# Patient Record
Sex: Male | Born: 1965 | Race: White | Hispanic: No | Marital: Married | Smoking: Never smoker
Health system: Southern US, Community
[De-identification: ages and names within clinical notes are randomized; demographics above are authoritative.]

## PROBLEM LIST (undated history)

## (undated) DIAGNOSIS — D126 Benign neoplasm of colon, unspecified: Secondary | ICD-10-CM

## (undated) DIAGNOSIS — M109 Gout, unspecified: Secondary | ICD-10-CM

## (undated) DIAGNOSIS — I10 Essential (primary) hypertension: Secondary | ICD-10-CM

## (undated) DIAGNOSIS — R011 Cardiac murmur, unspecified: Secondary | ICD-10-CM

## (undated) DIAGNOSIS — N528 Other male erectile dysfunction: Secondary | ICD-10-CM

## (undated) DIAGNOSIS — E785 Hyperlipidemia, unspecified: Secondary | ICD-10-CM

## (undated) DIAGNOSIS — I7300 Raynaud's syndrome without gangrene: Secondary | ICD-10-CM

## (undated) DIAGNOSIS — R001 Bradycardia, unspecified: Secondary | ICD-10-CM

## (undated) DIAGNOSIS — T7840XA Allergy, unspecified, initial encounter: Secondary | ICD-10-CM

## (undated) HISTORY — DX: Cardiac murmur, unspecified: R01.1

## (undated) HISTORY — DX: Essential (primary) hypertension: I10

## (undated) HISTORY — DX: Bradycardia, unspecified: R00.1

## (undated) HISTORY — DX: Benign neoplasm of colon, unspecified: D12.6

## (undated) HISTORY — DX: Hyperlipidemia, unspecified: E78.5

## (undated) HISTORY — DX: Other male erectile dysfunction: N52.8

## (undated) HISTORY — DX: Allergy, unspecified, initial encounter: T78.40XA

## (undated) HISTORY — PX: COARCTATION OF AORTA REPAIR: SHX261

## (undated) HISTORY — PX: HERNIA REPAIR: SHX51

## (undated) HISTORY — DX: Gout, unspecified: M10.9

## (undated) HISTORY — DX: Raynaud's syndrome without gangrene: I73.00

---

## 2002-04-28 ENCOUNTER — Encounter: Admission: RE | Admit: 2002-04-28 | Discharge: 2002-04-28 | Payer: Self-pay | Admitting: Sports Medicine

## 2006-02-01 ENCOUNTER — Encounter: Admission: RE | Admit: 2006-02-01 | Discharge: 2006-02-01 | Payer: Self-pay | Admitting: Neurology

## 2011-03-06 DIAGNOSIS — R9431 Abnormal electrocardiogram [ECG] [EKG]: Secondary | ICD-10-CM | POA: Insufficient documentation

## 2011-03-06 DIAGNOSIS — I10 Essential (primary) hypertension: Secondary | ICD-10-CM | POA: Insufficient documentation

## 2011-03-06 DIAGNOSIS — Q251 Coarctation of aorta: Secondary | ICD-10-CM | POA: Insufficient documentation

## 2016-03-25 DIAGNOSIS — Z0001 Encounter for general adult medical examination with abnormal findings: Secondary | ICD-10-CM | POA: Diagnosis not present

## 2016-03-25 DIAGNOSIS — Z23 Encounter for immunization: Secondary | ICD-10-CM | POA: Diagnosis not present

## 2016-03-25 DIAGNOSIS — Z0000 Encounter for general adult medical examination without abnormal findings: Secondary | ICD-10-CM | POA: Diagnosis not present

## 2016-03-25 DIAGNOSIS — Z6825 Body mass index (BMI) 25.0-25.9, adult: Secondary | ICD-10-CM | POA: Diagnosis not present

## 2016-06-26 DIAGNOSIS — E782 Mixed hyperlipidemia: Secondary | ICD-10-CM | POA: Diagnosis not present

## 2016-06-26 DIAGNOSIS — R7301 Impaired fasting glucose: Secondary | ICD-10-CM | POA: Diagnosis not present

## 2016-06-26 DIAGNOSIS — I10 Essential (primary) hypertension: Secondary | ICD-10-CM | POA: Diagnosis not present

## 2016-06-26 DIAGNOSIS — I7300 Raynaud's syndrome without gangrene: Secondary | ICD-10-CM | POA: Diagnosis not present

## 2016-08-09 DIAGNOSIS — H00019 Hordeolum externum unspecified eye, unspecified eyelid: Secondary | ICD-10-CM | POA: Diagnosis not present

## 2016-08-12 DIAGNOSIS — H0014 Chalazion left upper eyelid: Secondary | ICD-10-CM | POA: Diagnosis not present

## 2016-11-27 DIAGNOSIS — E782 Mixed hyperlipidemia: Secondary | ICD-10-CM | POA: Diagnosis not present

## 2016-11-27 DIAGNOSIS — I10 Essential (primary) hypertension: Secondary | ICD-10-CM | POA: Diagnosis not present

## 2016-11-27 DIAGNOSIS — R7301 Impaired fasting glucose: Secondary | ICD-10-CM | POA: Diagnosis not present

## 2017-06-16 DIAGNOSIS — R7301 Impaired fasting glucose: Secondary | ICD-10-CM | POA: Diagnosis not present

## 2017-06-16 DIAGNOSIS — M545 Low back pain: Secondary | ICD-10-CM | POA: Diagnosis not present

## 2017-06-16 DIAGNOSIS — Z125 Encounter for screening for malignant neoplasm of prostate: Secondary | ICD-10-CM | POA: Diagnosis not present

## 2017-06-16 DIAGNOSIS — E782 Mixed hyperlipidemia: Secondary | ICD-10-CM | POA: Diagnosis not present

## 2017-06-16 DIAGNOSIS — Z0001 Encounter for general adult medical examination with abnormal findings: Secondary | ICD-10-CM | POA: Diagnosis not present

## 2017-06-16 DIAGNOSIS — N528 Other male erectile dysfunction: Secondary | ICD-10-CM | POA: Diagnosis not present

## 2017-07-01 DIAGNOSIS — Q251 Coarctation of aorta: Secondary | ICD-10-CM | POA: Diagnosis not present

## 2017-07-01 DIAGNOSIS — I158 Other secondary hypertension: Secondary | ICD-10-CM | POA: Diagnosis not present

## 2017-07-08 DIAGNOSIS — I371 Nonrheumatic pulmonary valve insufficiency: Secondary | ICD-10-CM | POA: Diagnosis not present

## 2017-07-08 DIAGNOSIS — I517 Cardiomegaly: Secondary | ICD-10-CM | POA: Diagnosis not present

## 2017-07-08 DIAGNOSIS — R001 Bradycardia, unspecified: Secondary | ICD-10-CM | POA: Diagnosis not present

## 2017-07-08 DIAGNOSIS — I351 Nonrheumatic aortic (valve) insufficiency: Secondary | ICD-10-CM | POA: Diagnosis not present

## 2017-07-08 DIAGNOSIS — Q251 Coarctation of aorta: Secondary | ICD-10-CM | POA: Diagnosis not present

## 2017-08-04 ENCOUNTER — Encounter: Payer: Self-pay | Admitting: Family Medicine

## 2017-08-17 ENCOUNTER — Encounter: Payer: Self-pay | Admitting: Gastroenterology

## 2017-08-23 ENCOUNTER — Other Ambulatory Visit: Payer: Self-pay

## 2017-08-23 ENCOUNTER — Ambulatory Visit (AMBULATORY_SURGERY_CENTER): Payer: Self-pay

## 2017-08-23 VITALS — Ht 71.0 in | Wt 180.6 lb

## 2017-08-23 DIAGNOSIS — J208 Acute bronchitis due to other specified organisms: Secondary | ICD-10-CM | POA: Diagnosis not present

## 2017-08-23 DIAGNOSIS — Z1211 Encounter for screening for malignant neoplasm of colon: Secondary | ICD-10-CM

## 2017-08-23 MED ORDER — PEG-KCL-NACL-NASULF-NA ASC-C 140 G PO SOLR: 1.0000 | Freq: Once | ORAL | 0 refills | Status: AC

## 2017-08-23 NOTE — Progress Notes (Signed)
Denies allergies to eggs or soy products. Denies complication of anesthesia or sedation. Denies use of weight loss medication. Denies use of O2.   Emmi instructions declined.  

## 2017-08-25 ENCOUNTER — Encounter: Payer: Self-pay | Admitting: Gastroenterology

## 2017-09-08 ENCOUNTER — Encounter: Payer: Self-pay | Admitting: Gastroenterology

## 2017-09-08 ENCOUNTER — Ambulatory Visit (AMBULATORY_SURGERY_CENTER): Payer: BLUE CROSS/BLUE SHIELD | Admitting: Gastroenterology

## 2017-09-08 ENCOUNTER — Other Ambulatory Visit: Payer: Self-pay

## 2017-09-08 VITALS — BP 129/85 | HR 46 | Temp 97.5°F | Resp 10 | Ht 71.0 in | Wt 180.0 lb

## 2017-09-08 DIAGNOSIS — D123 Benign neoplasm of transverse colon: Secondary | ICD-10-CM | POA: Diagnosis not present

## 2017-09-08 DIAGNOSIS — D121 Benign neoplasm of appendix: Secondary | ICD-10-CM | POA: Diagnosis not present

## 2017-09-08 DIAGNOSIS — K635 Polyp of colon: Secondary | ICD-10-CM

## 2017-09-08 DIAGNOSIS — D120 Benign neoplasm of cecum: Secondary | ICD-10-CM | POA: Diagnosis not present

## 2017-09-08 DIAGNOSIS — Z1211 Encounter for screening for malignant neoplasm of colon: Secondary | ICD-10-CM

## 2017-09-08 MED ORDER — SODIUM CHLORIDE 0.9 % IV SOLN: 500.0000 mL | Freq: Once | INTRAVENOUS | Status: DC

## 2017-09-08 NOTE — Progress Notes (Signed)
Pt's states no medical or surgical changes since previsit or office visit. 

## 2017-09-08 NOTE — Op Note (Signed)
[place] Patient Name: Dillon Lee Procedure Date: 09/08/2017 2:13 PM MRN: [id number] Endoscopist: Mallie Mussel L. Loletha Carrow , MD Age: 52 Referring MD:  Date of Birth: 1965-12-14 Gender: Male Account #: [id number] Procedure:                Colonoscopy Indications:              Screening for colorectal malignant neoplasm, This                            is the patient's first colonoscopy Medicines:                Monitored Anesthesia Care Procedure:                Pre-Anesthesia Assessment:                           - Prior to the procedure, a History and Physical                            was performed, and patient medications and                            allergies were reviewed. The patient's tolerance of                            previous anesthesia was also reviewed. The risks                            and benefits of the procedure and the sedation                            options and risks were discussed with the patient.                            All questions were answered, and informed consent                            was obtained. Prior Anticoagulants: The patient has                            taken no previous anticoagulant or antiplatelet                            agents. ASA Grade Assessment: II - A patient with                            mild systemic disease. After reviewing the risks                            and benefits, the patient was deemed in                            satisfactory condition to undergo the procedure.  After obtaining informed consent, the colonoscope                            was passed under direct vision. Throughout the                            procedure, the patient's blood pressure, pulse, and                            oxygen saturations were monitored continuously. The                            Colonoscope was introduced through the anus and                            advanced to the the cecum,  identified by                            appendiceal orifice and ileocecal valve. The                            colonoscopy was performed without difficulty. The                            patient tolerated the procedure well. The quality                            of the bowel preparation was good. The ileocecal                            valve, appendiceal orifice, and rectum were                            photographed. The quality of the bowel preparation                            was evaluated using the BBPS [facility] Bowel                            Preparation Scale) with scores of: Right Colon = 2,                            Transverse Colon = 2 and Left Colon = 2. The total                            BBPS score equals 6. Scope In: 2:18:55 PM Scope Out: 2:34:28 PM Scope Withdrawal Time: 0 hours 11 minutes 36 seconds  Total Procedure Duration: 0 hours 15 minutes 33 seconds  Findings:                 The perianal and digital rectal examinations were                            normal.  Two sessile polyps were found in the hepatic                            flexure and appendiceal orifice. The polyps were 3                            (AO) to 6(TC) mm in size. These polyps were removed                            with a cold snare. Resection and retrieval were                            complete.                           The exam was otherwise without abnormality on                            direct and retroflexion views. Complications:            No immediate complications. Estimated Blood Loss:     Estimated blood loss was minimal. Impression:               - Two 3 to 6 mm polyps at the hepatic flexure and                            at the appendiceal orifice, removed with a cold                            snare. Resected and retrieved.                           - The examination was otherwise normal on direct                            and retroflexion  views. Recommendation:           - Patient has a contact number available for                            emergencies. The signs and symptoms of potential                            delayed complications were discussed with the                            patient. Return to normal activities tomorrow.                            Written discharge instructions were provided to the                            patient.                           -  Resume previous diet.                           - Continue present medications.                           - Await pathology results.                           - Repeat colonoscopy is recommended for                            surveillance. The colonoscopy date will be                            determined after pathology results from today's                            exam become available for review. Ozetta Flatley L. Loletha Carrow, MD 09/08/2017 2:40:01 PM This report has been signed electronically.

## 2017-09-08 NOTE — Progress Notes (Signed)
Called to room to assist during endoscopic procedure.  Patient ID and intended procedure confirmed with present staff. Received instructions for my participation in the procedure from the performing physician.  

## 2017-09-08 NOTE — Patient Instructions (Signed)
YOU HAD AN ENDOSCOPIC PROCEDURE TODAY AT [facility]:   Refer to the procedure report that was given to you for any specific questions about what was found during the examination.  If the procedure report does not answer your questions, please call your gastroenterologist to clarify.  If you requested that your care partner not be given the details of your procedure findings, then the procedure report has been included in a sealed envelope for you to review at your convenience later.  YOU SHOULD EXPECT: Some feelings of bloating in the abdomen. Passage of more gas than usual.  Walking can help get rid of the air that was put into your GI tract during the procedure and reduce the bloating. If you had a lower endoscopy (such as a colonoscopy or flexible sigmoidoscopy) you may notice spotting of blood in your stool or on the toilet paper. If you underwent a bowel prep for your procedure, you may not have a normal bowel movement for a few days.  Please Note:  You might notice some irritation and congestion in your nose or some drainage.  This is from the oxygen used during your procedure.  There is no need for concern and it should clear up in a day or so.  SYMPTOMS TO REPORT IMMEDIATELY:   Following lower endoscopy (colonoscopy or flexible sigmoidoscopy):  Excessive amounts of blood in the stool  Significant tenderness or worsening of abdominal pains  Swelling of the abdomen that is new, acute  Fever of 100F or higher   Following upper endoscopy (EGD)  Vomiting of blood or coffee ground material  New chest pain or pain under the shoulder blades  Painful or persistently difficult swallowing  New shortness of breath  Fever of 100F or higher  Black, tarry-looking stools  For urgent or emergent issues, a gastroenterologist can be reached at any hour by calling [phone].   DIET:  We do recommend a small meal at first, but then you may proceed to your regular diet.  Drink  plenty of fluids but you should avoid alcoholic beverages for 24 hours.  ACTIVITY:  You should plan to take it easy for the rest of today and you should NOT DRIVE or use heavy machinery until tomorrow (because of the sedation medicines used during the test).    FOLLOW UP: Our staff will call the number listed on your records the next business day following your procedure to check on you and address any questions or concerns that you may have regarding the information given to you following your procedure. If we do not reach you, we will leave a message.  However, if you are feeling well and you are not experiencing any problems, there is no need to return our call.  We will assume that you have returned to your regular daily activities without incident.  If any biopsies were taken you will be contacted by phone or by letter within the next 1-3 weeks.  Please call us at [phone] if you have not heard about the biopsies in 3 weeks.    SIGNATURES/CONFIDENTIALITY: You and/or your care partner have signed paperwork which will be entered into your electronic medical record.  These signatures attest to the fact that that the information above on your After Visit Summary has been reviewed and is understood.  Full responsibility of the confidentiality of this discharge information lies with you and/or your care-partner.YOU HAD AN ENDOSCOPIC PROCEDURE TODAY AT [facility]:  Refer to the procedure report that was given to you for any specific questions about what was found during the examination.  If the procedure report does not answer your questions, please call your gastroenterologist to clarify.  If you requested that your care partner not be given the details of your procedure findings, then the procedure report has been included in a sealed envelope for you to review at your convenience later.  YOU SHOULD EXPECT: Some feelings of bloating in the abdomen. Passage of more gas than  usual.  Walking can help get rid of the air that was put into your GI tract during the procedure and reduce the bloating. If you had a lower endoscopy (such as a colonoscopy or flexible sigmoidoscopy) you may notice spotting of blood in your stool or on the toilet paper. If you underwent a bowel prep for your procedure, you may not have a normal bowel movement for a few days.  Please Note:  You might notice some irritation and congestion in your nose or some drainage.  This is from the oxygen used during your procedure.  There is no need for concern and it should clear up in a day or so.  SYMPTOMS TO REPORT IMMEDIATELY:   Following lower endoscopy (colonoscopy or flexible sigmoidoscopy):  Excessive amounts of blood in the stool  Significant tenderness or worsening of abdominal pains  Swelling of the abdomen that is new, acute  Fever of 100F or higher   Following upper endoscopy (EGD)  Vomiting of blood or coffee ground material  New chest pain or pain under the shoulder blades  Painful or persistently difficult swallowing  New shortness of breath  Fever of 100F or higher  Black, tarry-looking stools  For urgent or emergent issues, a gastroenterologist can be reached at any hour by calling [phone].   DIET:  We do recommend a small meal at first, but then you may proceed to your regular diet.  Drink plenty of fluids but you should avoid alcoholic beverages for 24 hours.  ACTIVITY:  You should plan to take it easy for the rest of today and you should NOT DRIVE or use heavy machinery until tomorrow (because of the sedation medicines used during the test).    FOLLOW UP: Our staff will call the number listed on your records the next business day following your procedure to check on you and address any questions or concerns that you may have regarding the information given to you following your procedure. If we do not reach you, we will leave a message.  However, if you are feeling  well and you are not experiencing any problems, there is no need to return our call.  We will assume that you have returned to your regular daily activities without incident.  If any biopsies were taken you will be contacted by phone or by letter within the next 1-3 weeks.  Please call us at [phone] if you have not heard about the biopsies in 3 weeks.   SIGNATURES/CONFIDENTIALITY: You and/or your care partner have signed paperwork which will be entered into your electronic medical record.  These signatures attest to the fact that that the information above on your After Visit Summary has been reviewed and is understood.  Full responsibility of the confidentiality of this discharge information lies with you and/or your care-partner.  Await pathology  Please read over handout about polyps  Continue your normal medications

## 2017-09-08 NOTE — Progress Notes (Signed)
A and O x3. Report to RN. Tolerated MAC anesthesia well.

## 2017-09-09 ENCOUNTER — Telehealth: Payer: Self-pay

## 2017-09-09 NOTE — Telephone Encounter (Signed)
  Follow up Call-  Call back number 09/08/2017  Post procedure Call Back phone  # [phone] cell  Permission to leave phone message Yes  Some recent data might be hidden     Patient questions:  Do you have a fever, pain , or abdominal swelling? No. Pain Score  0 *  Have you tolerated food without any problems? Yes.    Have you been able to return to your normal activities? Yes.    Do you have any questions about your discharge instructions: Diet   No. Medications  No. Follow up visit  No.  Do you have questions or concerns about your Care? No.  Actions: * If pain score is 4 or above: No action needed, pain <4.

## 2017-09-17 DIAGNOSIS — S99921A Unspecified injury of right foot, initial encounter: Secondary | ICD-10-CM | POA: Diagnosis not present

## 2017-09-19 ENCOUNTER — Encounter: Payer: Self-pay | Admitting: Gastroenterology

## 2017-11-12 DIAGNOSIS — S86112A Strain of other muscle(s) and tendon(s) of posterior muscle group at lower leg level, left leg, initial encounter: Secondary | ICD-10-CM | POA: Diagnosis not present

## 2018-04-11 DIAGNOSIS — Z23 Encounter for immunization: Secondary | ICD-10-CM | POA: Diagnosis not present

## 2018-04-11 DIAGNOSIS — I10 Essential (primary) hypertension: Secondary | ICD-10-CM | POA: Diagnosis not present

## 2018-04-11 DIAGNOSIS — E782 Mixed hyperlipidemia: Secondary | ICD-10-CM | POA: Diagnosis not present

## 2018-04-11 DIAGNOSIS — R7301 Impaired fasting glucose: Secondary | ICD-10-CM | POA: Diagnosis not present

## 2018-04-11 DIAGNOSIS — E7800 Pure hypercholesterolemia, unspecified: Secondary | ICD-10-CM | POA: Diagnosis not present

## 2018-07-05 DIAGNOSIS — J020 Streptococcal pharyngitis: Secondary | ICD-10-CM | POA: Diagnosis not present

## 2019-01-14 DIAGNOSIS — S0512XA Contusion of eyeball and orbital tissues, left eye, initial encounter: Secondary | ICD-10-CM | POA: Diagnosis not present

## 2019-01-14 DIAGNOSIS — S0990XA Unspecified injury of head, initial encounter: Secondary | ICD-10-CM | POA: Diagnosis not present

## 2019-01-14 DIAGNOSIS — S01112A Laceration without foreign body of left eyelid and periocular area, initial encounter: Secondary | ICD-10-CM | POA: Diagnosis not present

## 2019-01-14 DIAGNOSIS — S022XXA Fracture of nasal bones, initial encounter for closed fracture: Secondary | ICD-10-CM | POA: Diagnosis not present

## 2019-01-14 DIAGNOSIS — W010XXA Fall on same level from slipping, tripping and stumbling without subsequent striking against object, initial encounter: Secondary | ICD-10-CM | POA: Diagnosis not present

## 2019-01-14 DIAGNOSIS — Y999 Unspecified external cause status: Secondary | ICD-10-CM | POA: Diagnosis not present

## 2019-01-19 DIAGNOSIS — S0181XA Laceration without foreign body of other part of head, initial encounter: Secondary | ICD-10-CM | POA: Insufficient documentation

## 2019-01-19 DIAGNOSIS — S01112A Laceration without foreign body of left eyelid and periocular area, initial encounter: Secondary | ICD-10-CM | POA: Diagnosis not present

## 2019-01-19 DIAGNOSIS — S0181XD Laceration without foreign body of other part of head, subsequent encounter: Secondary | ICD-10-CM | POA: Diagnosis not present

## 2019-01-26 DIAGNOSIS — Z20828 Contact with and (suspected) exposure to other viral communicable diseases: Secondary | ICD-10-CM | POA: Diagnosis not present

## 2019-02-03 DIAGNOSIS — Z20828 Contact with and (suspected) exposure to other viral communicable diseases: Secondary | ICD-10-CM | POA: Diagnosis not present

## 2019-02-09 DIAGNOSIS — Z20828 Contact with and (suspected) exposure to other viral communicable diseases: Secondary | ICD-10-CM | POA: Diagnosis not present

## 2019-02-23 DIAGNOSIS — Z23 Encounter for immunization: Secondary | ICD-10-CM | POA: Diagnosis not present

## 2019-02-23 DIAGNOSIS — E782 Mixed hyperlipidemia: Secondary | ICD-10-CM | POA: Diagnosis not present

## 2019-02-23 DIAGNOSIS — I10 Essential (primary) hypertension: Secondary | ICD-10-CM | POA: Diagnosis not present

## 2019-02-23 DIAGNOSIS — U071 COVID-19: Secondary | ICD-10-CM | POA: Diagnosis not present

## 2019-02-28 DIAGNOSIS — S01112D Laceration without foreign body of left eyelid and periocular area, subsequent encounter: Secondary | ICD-10-CM | POA: Diagnosis not present

## 2019-03-01 DIAGNOSIS — I10 Essential (primary) hypertension: Secondary | ICD-10-CM | POA: Diagnosis not present

## 2019-03-01 DIAGNOSIS — Q251 Coarctation of aorta: Secondary | ICD-10-CM | POA: Diagnosis not present

## 2019-03-01 DIAGNOSIS — I158 Other secondary hypertension: Secondary | ICD-10-CM | POA: Diagnosis not present

## 2019-03-08 DIAGNOSIS — N5201 Erectile dysfunction due to arterial insufficiency: Secondary | ICD-10-CM | POA: Diagnosis not present

## 2019-04-05 ENCOUNTER — Other Ambulatory Visit: Payer: Self-pay | Admitting: Family Medicine

## 2019-04-05 DIAGNOSIS — I10 Essential (primary) hypertension: Secondary | ICD-10-CM | POA: Diagnosis not present

## 2019-04-05 DIAGNOSIS — R7301 Impaired fasting glucose: Secondary | ICD-10-CM | POA: Diagnosis not present

## 2019-04-05 DIAGNOSIS — E782 Mixed hyperlipidemia: Secondary | ICD-10-CM | POA: Diagnosis not present

## 2019-04-14 DIAGNOSIS — I10 Essential (primary) hypertension: Secondary | ICD-10-CM | POA: Diagnosis not present

## 2019-04-27 DIAGNOSIS — I10 Essential (primary) hypertension: Secondary | ICD-10-CM | POA: Diagnosis not present

## 2019-04-27 DIAGNOSIS — Z0000 Encounter for general adult medical examination without abnormal findings: Secondary | ICD-10-CM | POA: Diagnosis not present

## 2019-04-27 DIAGNOSIS — Z6825 Body mass index (BMI) 25.0-25.9, adult: Secondary | ICD-10-CM | POA: Diagnosis not present

## 2019-07-12 DIAGNOSIS — M47814 Spondylosis without myelopathy or radiculopathy, thoracic region: Secondary | ICD-10-CM | POA: Diagnosis not present

## 2019-07-12 DIAGNOSIS — S29019A Strain of muscle and tendon of unspecified wall of thorax, initial encounter: Secondary | ICD-10-CM | POA: Diagnosis not present

## 2019-07-17 ENCOUNTER — Telehealth: Payer: BC Managed Care – PPO | Admitting: Family Medicine

## 2019-07-19 DIAGNOSIS — M6281 Muscle weakness (generalized): Secondary | ICD-10-CM | POA: Diagnosis not present

## 2019-07-19 DIAGNOSIS — M546 Pain in thoracic spine: Secondary | ICD-10-CM | POA: Diagnosis not present

## 2019-07-20 ENCOUNTER — Other Ambulatory Visit: Payer: Self-pay | Admitting: Family Medicine

## 2019-07-24 DIAGNOSIS — M546 Pain in thoracic spine: Secondary | ICD-10-CM | POA: Diagnosis not present

## 2019-07-24 DIAGNOSIS — M6281 Muscle weakness (generalized): Secondary | ICD-10-CM | POA: Diagnosis not present

## 2019-07-26 ENCOUNTER — Other Ambulatory Visit: Payer: Self-pay | Admitting: Family Medicine

## 2019-07-26 DIAGNOSIS — I10 Essential (primary) hypertension: Secondary | ICD-10-CM | POA: Diagnosis not present

## 2019-07-26 DIAGNOSIS — Q251 Coarctation of aorta: Secondary | ICD-10-CM | POA: Diagnosis not present

## 2019-07-27 DIAGNOSIS — M546 Pain in thoracic spine: Secondary | ICD-10-CM | POA: Diagnosis not present

## 2019-07-27 DIAGNOSIS — M6281 Muscle weakness (generalized): Secondary | ICD-10-CM | POA: Diagnosis not present

## 2019-07-31 DIAGNOSIS — M6281 Muscle weakness (generalized): Secondary | ICD-10-CM | POA: Diagnosis not present

## 2019-07-31 DIAGNOSIS — M546 Pain in thoracic spine: Secondary | ICD-10-CM | POA: Diagnosis not present

## 2019-08-03 DIAGNOSIS — M6281 Muscle weakness (generalized): Secondary | ICD-10-CM | POA: Diagnosis not present

## 2019-08-03 DIAGNOSIS — M546 Pain in thoracic spine: Secondary | ICD-10-CM | POA: Diagnosis not present

## 2019-08-07 DIAGNOSIS — M6281 Muscle weakness (generalized): Secondary | ICD-10-CM | POA: Diagnosis not present

## 2019-08-07 DIAGNOSIS — M546 Pain in thoracic spine: Secondary | ICD-10-CM | POA: Diagnosis not present

## 2019-08-08 ENCOUNTER — Other Ambulatory Visit: Payer: Self-pay

## 2019-08-08 DIAGNOSIS — I10 Essential (primary) hypertension: Secondary | ICD-10-CM

## 2019-08-09 LAB — COMPREHENSIVE METABOLIC PANEL
ALT: 22 IU/L (ref 0–44)
AST: 34 IU/L (ref 0–40)
Albumin/Globulin Ratio: 2.8 — ABNORMAL HIGH (ref 1.2–2.2)
Albumin: 5.1 g/dL — ABNORMAL HIGH (ref 3.8–4.9)
Alkaline Phosphatase: 47 IU/L (ref 39–117)
BUN/Creatinine Ratio: 10 (ref 9–20)
BUN: 12 mg/dL (ref 6–24)
Bilirubin Total: 0.6 mg/dL (ref 0.0–1.2)
CO2: 28 mmol/L (ref 20–29)
Calcium: 10.1 mg/dL (ref 8.7–10.2)
Chloride: 88 mmol/L — ABNORMAL LOW (ref 96–106)
Creatinine, Ser: 1.23 mg/dL (ref 0.76–1.27)
GFR calc Af Amer: 77 mL/min/{1.73_m2} (ref >59)
GFR calc non Af Amer: 67 mL/min/{1.73_m2} (ref >59)
Globulin, Total: 1.8 g/dL (ref 1.5–4.5)
Glucose: 85 mg/dL (ref 65–99)
Potassium: 4 mmol/L (ref 3.5–5.2)
Sodium: 131 mmol/L — ABNORMAL LOW (ref 134–144)
Total Protein: 6.9 g/dL (ref 6.0–8.5)

## 2019-08-10 DIAGNOSIS — M6281 Muscle weakness (generalized): Secondary | ICD-10-CM | POA: Diagnosis not present

## 2019-08-10 DIAGNOSIS — M546 Pain in thoracic spine: Secondary | ICD-10-CM | POA: Diagnosis not present

## 2019-08-23 DIAGNOSIS — I10 Essential (primary) hypertension: Secondary | ICD-10-CM | POA: Diagnosis not present

## 2019-08-23 DIAGNOSIS — Q251 Coarctation of aorta: Secondary | ICD-10-CM | POA: Diagnosis not present

## 2019-08-23 LAB — LIPID PANEL W/O CHOL/HDL RATIO
Cholesterol, Total: 224 mg/dL — ABNORMAL HIGH (ref 100–199)
HDL: 86 mg/dL (ref >39)
LDL Chol Calc (NIH): 121 mg/dL — ABNORMAL HIGH (ref 0–99)
Triglycerides: 98 mg/dL (ref 0–149)
VLDL Cholesterol Cal: 17 mg/dL (ref 5–40)

## 2019-08-23 LAB — COMPREHENSIVE METABOLIC PANEL
ALT: 29 IU/L (ref 0–44)
AST: 34 IU/L (ref 0–40)
Albumin/Globulin Ratio: 2.2 (ref 1.2–2.2)
Albumin: 5.1 g/dL — ABNORMAL HIGH (ref 3.8–4.9)
Alkaline Phosphatase: 45 IU/L (ref 39–117)
BUN/Creatinine Ratio: 13 (ref 9–20)
BUN: 14 mg/dL (ref 6–24)
Bilirubin Total: 0.8 mg/dL (ref 0.0–1.2)
CO2: 27 mmol/L (ref 20–29)
Calcium: 9.8 mg/dL (ref 8.7–10.2)
Chloride: 95 mmol/L — ABNORMAL LOW (ref 96–106)
Creatinine, Ser: 1.11 mg/dL (ref 0.76–1.27)
GFR calc Af Amer: 88 mL/min/{1.73_m2} (ref >59)
GFR calc non Af Amer: 76 mL/min/{1.73_m2} (ref >59)
Globulin, Total: 2.3 g/dL (ref 1.5–4.5)
Glucose: 91 mg/dL (ref 65–99)
Potassium: 4.8 mmol/L (ref 3.5–5.2)
Sodium: 135 mmol/L (ref 134–144)
Total Protein: 7.4 g/dL (ref 6.0–8.5)

## 2019-08-23 LAB — RENIN: Renin: 3.26 ng/mL/hr (ref 0.167–5.380)

## 2019-08-23 LAB — CARDIOVASCULAR RISK ASSESSMENT

## 2019-08-23 LAB — HGB A1C W/O EAG: Hgb A1c MFr Bld: 5.3 % (ref 4.8–5.6)

## 2019-09-01 DIAGNOSIS — S29019A Strain of muscle and tendon of unspecified wall of thorax, initial encounter: Secondary | ICD-10-CM | POA: Diagnosis not present

## 2019-09-03 DIAGNOSIS — M549 Dorsalgia, unspecified: Secondary | ICD-10-CM | POA: Diagnosis not present

## 2019-09-03 DIAGNOSIS — N3000 Acute cystitis without hematuria: Secondary | ICD-10-CM | POA: Diagnosis not present

## 2019-09-07 ENCOUNTER — Encounter: Payer: Self-pay | Admitting: Legal Medicine

## 2019-09-07 ENCOUNTER — Ambulatory Visit: Payer: BC Managed Care – PPO | Admitting: Legal Medicine

## 2019-09-07 ENCOUNTER — Other Ambulatory Visit: Payer: Self-pay | Admitting: Family Medicine

## 2019-09-07 ENCOUNTER — Other Ambulatory Visit: Payer: Self-pay

## 2019-09-07 VITALS — BP 118/78 | HR 50 | Temp 97.0°F | Ht 71.0 in | Wt 183.0 lb

## 2019-09-07 DIAGNOSIS — R1013 Epigastric pain: Secondary | ICD-10-CM

## 2019-09-07 DIAGNOSIS — M546 Pain in thoracic spine: Secondary | ICD-10-CM

## 2019-09-07 DIAGNOSIS — M549 Dorsalgia, unspecified: Secondary | ICD-10-CM | POA: Insufficient documentation

## 2019-09-07 NOTE — Assessment & Plan Note (Signed)
Back pain slowly improving under left lateral ribs.  With his intensive training, he may have avulsed intercostal muscle from rib and this can take extensive time to heal.  Patient is extremely anxious about pancreatic cancer that his mother died from.  I offered abdominal ultrasound t screen for this.

## 2019-09-07 NOTE — Progress Notes (Signed)
Acute Office Visit  Subjective:    Patient ID: Dillon Lee, male    DOB: 1965-08-22, 54 y.o.   MRN: [id number]  Chief Complaint  Patient presents with  . Back Pain    HPI Patient is in today for back pain.  Started in December 2020 and he saw [facility] and has PT and muscle relaxers.  He is improving and had recent massage.  He is using flector patches.  He had blood work her one month ago. He is worred about pancreatic  Cancer.  No other symptoms.  No abdominal pain, no weight loss.  No indigestion. He is doing high intensity training. We discussed at length.  Past Medical History:  Diagnosis Date  . Allergy   . Bradycardia, unspecified   . Gout   . Heart murmur   . Hyperlipidemia   . Hypertension   . Other male erectile dysfunction   . Raynaud's disease without gangrene     Past Surgical History:  Procedure Laterality Date  . COARCTATION OF AORTA REPAIR    . HERNIA REPAIR      Family History  Problem Relation Age of Onset  . Pancreatic cancer Mother   . Skin cancer Paternal Grandfather   . Colon cancer Neg Hx   . Esophageal cancer Neg Hx   . Liver cancer Neg Hx   . Rectal cancer Neg Hx   . Stomach cancer Neg Hx   . Prostate cancer Neg Hx     Social History   Socioeconomic History  . Marital status: Married    Spouse name: Not on file  . Number of children: Not on file  . Years of education: Not on file  . Highest education level: Not on file  Occupational History    Employer: [employer]  Tobacco Use  . Smoking status: Never Smoker  . Smokeless tobacco: Current User  . Tobacco comment: occasionally  Substance and Sexual Activity  . Alcohol use: Yes    Comment: weekly wine or beer  . Drug use: No  . Sexual activity: Not on file  Other Topics Concern  . Not on file  Social History Narrative  . Not on file   Social Determinants of Health   Financial Resource Strain:   . Difficulty of Paying Living Expenses:   Food Insecurity:   .  Worried About Charity fundraiser in the Last Year:   . Arboriculturist in the Last Year:   Transportation Needs:   . Film/video editor (Medical):   Marland Kitchen Lack of Transportation (Non-Medical):   Physical Activity:   . Days of Exercise per Week:   . Minutes of Exercise per Session:   Stress:   . Feeling of Stress :   Social Connections:   . Frequency of Communication with Friends and Family:   . Frequency of Social Gatherings with Friends and Family:   . Attends Religious Services:   . Active Member of Clubs or Organizations:   . Attends Archivist Meetings:   Marland Kitchen Marital Status:   Intimate Partner Violence:   . Fear of Current or Ex-Partner:   . Emotionally Abused:   Marland Kitchen Physically Abused:   . Sexually Abused:     Outpatient Medications Prior to Visit  Medication Sig Dispense Refill  . allopurinol (ZYLOPRIM) 100 MG tablet TAKE 1 TABLET DAILY 90 tablet 2  . amLODipine (NORVASC) 2.5 MG tablet Take 2.5 mg by mouth 2 (two) times daily.    Marland Kitchen  benazepril (LOTENSIN) 40 MG tablet Take 40 mg by mouth daily.    . carvedilol (COREG) 6.25 MG tablet Take 6.25 mg by mouth 2 (two) times daily.    . chlorthalidone (HYGROTON) 25 MG tablet Take 12.5 mg by mouth daily.    . cyclobenzaprine (FLEXERIL) 5 MG tablet Take 5 mg by mouth 2 (two) times daily as needed.    . diazepam (VALIUM) 5 MG tablet TAKE 1-2 TABLETS BY MOUTH EVERY DAY AS NEEDED FOR AIRLINE FLIGHTS 30 tablet 0  . diclofenac (FLECTOR) 1.3 % PTCH 1 patch 2 (two) times daily.    . rosuvastatin (CRESTOR) 20 MG tablet Take 20 mg by mouth at bedtime.    . tadalafil (CIALIS) 5 MG tablet Take 5 mg by mouth daily as needed for erectile dysfunction.    . traZODone (DESYREL) 50 MG tablet     . furosemide (LASIX) 20 MG tablet Take 20 mg by mouth.    . metoprolol succinate (TOPROL-XL) 25 MG 24 hr tablet Take 25 mg by mouth daily.    . rosuvastatin (CRESTOR) 10 MG tablet Take 10 mg by mouth daily.     Facility-Administered Medications Prior  to Visit  Medication Dose Route Frequency Provider Last Rate Last Admin  . 0.9 %  sodium chloride infusion  500 mL Intravenous Once Doran Stabler, MD        Allergies  Allergen Reactions  . Amlodipine Swelling  . Hydralazine Nausea Only  . Avelox [Moxifloxacin]     Review of Systems  Constitutional: Negative.   HENT: Negative.   Eyes: Negative.   Respiratory: Negative.   Cardiovascular: Negative.   Gastrointestinal: Negative.   Genitourinary: Negative.   Musculoskeletal: Positive for back pain.  Skin: Negative.   Neurological: Negative.   Psychiatric/Behavioral: Negative.        Objective:    Physical Exam Vitals reviewed.  Constitutional:      Appearance: Normal appearance.  Cardiovascular:     Rate and Rhythm: Normal rate and regular rhythm.     Pulses: Normal pulses.     Heart sounds: Normal heart sounds.  Pulmonary:     Effort: Pulmonary effort is normal.     Breath sounds: Normal breath sounds.  Abdominal:     General: Abdomen is flat. Bowel sounds are normal.     Palpations: Abdomen is soft.  Musculoskeletal:        General: Tenderness present.     Comments: Pain left lower ribs   Skin:    General: Skin is warm and dry.  Neurological:     General: No focal deficit present.     Mental Status: He is alert and oriented to person, place, and time. Mental status is at baseline.     BP 118/78   Pulse (!) 50   Temp (!) 97 F (36.1 C)   Ht 5\' 11"  (1.803 m)   Wt 183 lb (83 kg)   SpO2 92%   BMI 25.52 kg/m  Wt Readings from Last 3 Encounters:  09/07/19 183 lb (83 kg)  09/08/17 180 lb (81.6 kg)  08/23/17 180 lb 9.6 oz (81.9 kg)    Health Maintenance Due  Topic Date Due  . HIV Screening  Never done  . TETANUS/TDAP  06/05/2019    There are no preventive care reminders to display for this patient.   No results found for: TSH No results found for: WBC, HGB, HCT, MCV, PLT Lab Results  Component Value Date   NA  131 (L) 08/08/2019   K 4.0  08/08/2019   CO2 28 08/08/2019   GLUCOSE 85 08/08/2019   BUN 12 08/08/2019   CREATININE 1.23 08/08/2019   BILITOT 0.6 08/08/2019   ALKPHOS 47 08/08/2019   AST 34 08/08/2019   ALT 22 08/08/2019   PROT 6.9 08/08/2019   ALBUMIN 5.1 (H) 08/08/2019   CALCIUM 10.1 08/08/2019   Lab Results  Component Value Date   CHOL 224 (H) 04/05/2019   Lab Results  Component Value Date   HDL 86 04/05/2019   Lab Results  Component Value Date   LDLCALC 121 (H) 04/05/2019   Lab Results  Component Value Date   TRIG 98 04/05/2019   No results found for: CHOLHDL Lab Results  Component Value Date   HGBA1C 5.3 04/05/2019       Assessment & Plan:   Problem List Items Addressed This Visit      Other   Back pain    Back pain slowly improving under left lateral ribs.  With his intensive training, he may have avulsed intercostal muscle from rib and this can take extensive time to heal.  Patient is extremely anxious about pancreatic cancer that his mother died from.  I offered abdominal ultrasound t screen for this.      Relevant Medications   cyclobenzaprine (FLEXERIL) 5 MG tablet    Other Visit Diagnoses    Epigastric pain    -  Primary   Relevant Orders   US Abdomen Limited      Return in about 2 weeks (around 09/21/2019), or follow up on back and abdomnal pain. No orders of the defined types were placed in this encounter.    Reinaldo Meeker, MD

## 2019-09-13 DIAGNOSIS — M6281 Muscle weakness (generalized): Secondary | ICD-10-CM | POA: Diagnosis not present

## 2019-09-13 DIAGNOSIS — M546 Pain in thoracic spine: Secondary | ICD-10-CM | POA: Diagnosis not present

## 2019-09-18 DIAGNOSIS — M6281 Muscle weakness (generalized): Secondary | ICD-10-CM | POA: Diagnosis not present

## 2019-09-18 DIAGNOSIS — M546 Pain in thoracic spine: Secondary | ICD-10-CM | POA: Diagnosis not present

## 2019-09-18 DIAGNOSIS — R1013 Epigastric pain: Secondary | ICD-10-CM | POA: Diagnosis not present

## 2019-09-18 DIAGNOSIS — R109 Unspecified abdominal pain: Secondary | ICD-10-CM | POA: Diagnosis not present

## 2019-09-21 ENCOUNTER — Ambulatory Visit: Payer: BC Managed Care – PPO | Admitting: Legal Medicine

## 2019-09-27 DIAGNOSIS — M6281 Muscle weakness (generalized): Secondary | ICD-10-CM | POA: Diagnosis not present

## 2019-09-27 DIAGNOSIS — M546 Pain in thoracic spine: Secondary | ICD-10-CM | POA: Diagnosis not present

## 2019-10-04 DIAGNOSIS — M546 Pain in thoracic spine: Secondary | ICD-10-CM | POA: Diagnosis not present

## 2019-10-04 DIAGNOSIS — M6281 Muscle weakness (generalized): Secondary | ICD-10-CM | POA: Diagnosis not present

## 2019-10-09 ENCOUNTER — Other Ambulatory Visit: Payer: Self-pay

## 2019-10-09 MED ORDER — ROSUVASTATIN CALCIUM 40 MG PO TABS: 40.0000 mg | ORAL_TABLET | Freq: Every day | ORAL | 0 refills | Status: DC

## 2019-10-18 DIAGNOSIS — M545 Low back pain: Secondary | ICD-10-CM | POA: Diagnosis not present

## 2019-10-18 DIAGNOSIS — M549 Dorsalgia, unspecified: Secondary | ICD-10-CM | POA: Diagnosis not present

## 2019-10-18 DIAGNOSIS — M791 Myalgia, unspecified site: Secondary | ICD-10-CM | POA: Diagnosis not present

## 2019-11-07 ENCOUNTER — Other Ambulatory Visit: Payer: Self-pay | Admitting: Family Medicine

## 2019-11-30 DIAGNOSIS — I10 Essential (primary) hypertension: Secondary | ICD-10-CM | POA: Diagnosis not present

## 2019-11-30 DIAGNOSIS — M791 Myalgia, unspecified site: Secondary | ICD-10-CM | POA: Diagnosis not present

## 2019-11-30 DIAGNOSIS — M545 Low back pain: Secondary | ICD-10-CM | POA: Diagnosis not present

## 2019-11-30 DIAGNOSIS — Z6825 Body mass index (BMI) 25.0-25.9, adult: Secondary | ICD-10-CM | POA: Diagnosis not present

## 2020-01-08 ENCOUNTER — Other Ambulatory Visit: Payer: Self-pay

## 2020-01-08 MED ORDER — ROSUVASTATIN CALCIUM 40 MG PO TABS: 40.0000 mg | ORAL_TABLET | Freq: Every day | ORAL | 0 refills | Status: DC

## 2020-01-24 ENCOUNTER — Other Ambulatory Visit: Payer: Self-pay | Admitting: Family Medicine

## 2020-03-07 DIAGNOSIS — I10 Essential (primary) hypertension: Secondary | ICD-10-CM | POA: Diagnosis not present

## 2020-03-07 DIAGNOSIS — Z23 Encounter for immunization: Secondary | ICD-10-CM | POA: Diagnosis not present

## 2020-03-07 DIAGNOSIS — I158 Other secondary hypertension: Secondary | ICD-10-CM | POA: Diagnosis not present

## 2020-03-07 DIAGNOSIS — Q251 Coarctation of aorta: Secondary | ICD-10-CM | POA: Diagnosis not present

## 2020-03-11 DIAGNOSIS — L821 Other seborrheic keratosis: Secondary | ICD-10-CM | POA: Diagnosis not present

## 2020-03-11 DIAGNOSIS — D485 Neoplasm of uncertain behavior of skin: Secondary | ICD-10-CM | POA: Diagnosis not present

## 2020-03-11 DIAGNOSIS — B078 Other viral warts: Secondary | ICD-10-CM | POA: Diagnosis not present

## 2020-03-11 DIAGNOSIS — D2261 Melanocytic nevi of right upper limb, including shoulder: Secondary | ICD-10-CM | POA: Diagnosis not present

## 2020-03-11 DIAGNOSIS — D225 Melanocytic nevi of trunk: Secondary | ICD-10-CM | POA: Diagnosis not present

## 2020-03-11 DIAGNOSIS — L7211 Pilar cyst: Secondary | ICD-10-CM | POA: Diagnosis not present

## 2020-04-02 DIAGNOSIS — L7211 Pilar cyst: Secondary | ICD-10-CM | POA: Diagnosis not present

## 2020-04-02 DIAGNOSIS — D485 Neoplasm of uncertain behavior of skin: Secondary | ICD-10-CM | POA: Diagnosis not present

## 2020-04-02 DIAGNOSIS — L988 Other specified disorders of the skin and subcutaneous tissue: Secondary | ICD-10-CM | POA: Diagnosis not present

## 2020-04-08 ENCOUNTER — Other Ambulatory Visit: Payer: Self-pay | Admitting: Family Medicine

## 2020-04-11 ENCOUNTER — Other Ambulatory Visit: Payer: Self-pay | Admitting: Family Medicine

## 2020-04-30 ENCOUNTER — Other Ambulatory Visit: Payer: Self-pay

## 2020-04-30 ENCOUNTER — Encounter: Payer: Self-pay | Admitting: Family Medicine

## 2020-04-30 ENCOUNTER — Ambulatory Visit (INDEPENDENT_AMBULATORY_CARE_PROVIDER_SITE_OTHER): Payer: BC Managed Care – PPO | Admitting: Family Medicine

## 2020-04-30 VITALS — BP 124/80 | HR 62 | Temp 96.1°F | Resp 14 | Ht 71.0 in | Wt 186.0 lb

## 2020-04-30 DIAGNOSIS — F418 Other specified anxiety disorders: Secondary | ICD-10-CM

## 2020-04-30 DIAGNOSIS — R7303 Prediabetes: Secondary | ICD-10-CM

## 2020-04-30 DIAGNOSIS — I10 Essential (primary) hypertension: Secondary | ICD-10-CM

## 2020-04-30 DIAGNOSIS — Z125 Encounter for screening for malignant neoplasm of prostate: Secondary | ICD-10-CM | POA: Diagnosis not present

## 2020-04-30 DIAGNOSIS — E782 Mixed hyperlipidemia: Secondary | ICD-10-CM | POA: Diagnosis not present

## 2020-04-30 DIAGNOSIS — Z0000 Encounter for general adult medical examination without abnormal findings: Secondary | ICD-10-CM

## 2020-04-30 LAB — HEMOGLOBIN A1C
Est. average glucose Bld gHb Est-mCnc: 111 mg/dL
Hgb A1c MFr Bld: 5.5 % (ref 4.8–5.6)

## 2020-04-30 NOTE — Progress Notes (Signed)
Subjective:  Patient ID: Dillon Lee, male    DOB: [birth year]  Age: 54 y.o. MRN: [id number]  Chief Complaint  Patient presents with  . Hypertension    HPI Well Adult Physical: Patient here for a comprehensive physical exam.The patient reports no problems Do you take any herbs or supplements that were not prescribed by a doctor? no Are you taking calcium supplements? no Are you taking aspirin daily? no  Encounter for general adult medical examination without abnormal findings  Physical ("At Risk" items are starred): Patient's last physical exam was 1 year ago .  Smoking: Life-long non-smoker ;  Physical Activity: Exercises at least 3 times per week ;  Alcohol/Drug Use: Is a non-drinker ; No illicit drug use ;  Patient is not afflicted from Stress Incontinence and Urge Incontinence  Safety: reviewed. Patient wears a seat belt, has smoke detectors, has carbon monoxide detectors, practices appropriate gun safety, and wears sunscreen with extended sun exposure. Dental Care: biannual cleanings, brushes and flosses daily. Ophthalmology/Optometry: Annual visit.  Hearing loss: none Vision impairments: none   Follow up of hypertension, hyperlipidemia, and Bradycardia. On amlodipine 5 mg daily, benazepril 40 mg once daily, carvedilol 6.25 mg one twice a day, and chlorthalidone 25 mg 1/2 daily. 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 Currently on crestor 40 mg once daily at night.   Pt has anxiety associated with flying. Flies frequently due to work. Takes valium to fly. Occasionally, takes valium prior to presentations at work.   Pt as intermittently, recurrent back pain. Has completed physical therapy. Takes cyclobenzaprine.   Takes cialis for BPH.  Takes trazodone for insomnia.   Social Hx   Social History   Socioeconomic History  . Marital status: Married    Spouse name: Not on file  . Number of children: Not on file  . Years of  education: Not on file  . Highest education level: Not on file  Occupational History    Employer: [employer]  Tobacco Use  . Smoking status: Never Smoker  . Smokeless tobacco: Current User  . Tobacco comment: occasionally  Vaping Use  . Vaping Use: Never used  Substance and Sexual Activity  . Alcohol use: Yes    Comment: weekly wine or beer  . Drug use: No  . Sexual activity: Not on file  Other Topics Concern  . Not on file  Social History Narrative  . Not on file   Social Determinants of Health   Financial Resource Strain:   . Difficulty of Paying Living Expenses: Not on file  Food Insecurity:   . Worried About Charity fundraiser in the Last Year: Not on file  . Ran Out of Food in the Last Year: Not on file  Transportation Needs:   . Lack of Transportation (Medical): Not on file  . Lack of Transportation (Non-Medical): Not on file  Physical Activity:   . Days of Exercise per Week: Not on file  . Minutes of Exercise per Session: Not on file  Stress:   . Feeling of Stress : Not on file  Social Connections:   . Frequency of Communication with Friends and Family: Not on file  . Frequency of Social Gatherings with Friends and Family: Not on file  . Attends Religious Services: Not on file  . Active Member of Clubs or Organizations: Not on file  . Attends Archivist Meetings: Not on file  . Marital Status: Not on  file   Past Medical History:  Diagnosis Date  . Allergy   . Bradycardia, unspecified   . Gout   . Heart murmur   . Hyperlipidemia   . Hypertension   . Other male erectile dysfunction   . Raynaud's disease without gangrene    Past Surgical History:  Procedure Laterality Date  . COARCTATION OF AORTA REPAIR    . HERNIA REPAIR      Family History  Problem Relation Age of Onset  . Pancreatic cancer Mother   . Skin cancer Paternal Grandfather   . Colon cancer Neg Hx   . Esophageal cancer Neg Hx   . Liver cancer Neg Hx   . Rectal cancer Neg Hx    . Stomach cancer Neg Hx   . Prostate cancer Neg Hx     Review of Systems  Constitutional: Negative for chills and fever.  HENT: Negative for congestion, rhinorrhea and sore throat.   Respiratory: Negative for cough and shortness of breath.   Cardiovascular: Negative for chest pain and palpitations.  Gastrointestinal: Negative for abdominal pain, constipation, diarrhea, nausea and vomiting.  Genitourinary: Negative for dysuria and urgency.  Musculoskeletal: Negative for arthralgias, back pain and myalgias.  Neurological: Negative for dizziness and headaches.  Psychiatric/Behavioral: Negative for dysphoric mood. The patient is not nervous/anxious.      Objective:  BP 124/80   Pulse 62   Temp (!) 96.1 F (35.6 C)   Resp 14   Ht 5\' 11"  (1.803 m)   Wt 186 lb (84.4 kg)   BMI 25.94 kg/m   BP/Weight 04/30/2020 12/14/2954 07/09/3084  Systolic BP 578 469 629  Diastolic BP 80 78 85  Wt. (Lbs) 186 183 180  BMI 25.94 25.52 25.1    Physical Exam Vitals reviewed.  Constitutional:      Appearance: Normal appearance.  HENT:     Right Ear: Tympanic membrane, ear canal and external ear normal.     Left Ear: Tympanic membrane, ear canal and external ear normal.     Nose: Nose normal. No congestion or rhinorrhea.     Mouth/Throat:     Mouth: Mucous membranes are moist.     Pharynx: No oropharyngeal exudate or posterior oropharyngeal erythema.  Neck:     Vascular: No carotid bruit.  Cardiovascular:     Rate and Rhythm: Normal rate and regular rhythm.     Pulses: Normal pulses.     Heart sounds: Normal heart sounds.  Pulmonary:     Effort: Pulmonary effort is normal. No respiratory distress.     Breath sounds: Normal breath sounds. No wheezing, rhonchi or rales.  Abdominal:     General: Bowel sounds are normal.     Palpations: Abdomen is soft.     Tenderness: There is no abdominal tenderness.  Lymphadenopathy:     Cervical: No cervical adenopathy.  Neurological:     Mental  Status: He is alert.  Psychiatric:        Mood and Affect: Mood normal.        Behavior: Behavior normal.    Lab Results  Component Value Date   GLUCOSE 85 08/08/2019   CHOL 224 (H) 04/05/2019   TRIG 98 04/05/2019   HDL 86 04/05/2019   LDLCALC 121 (H) 04/05/2019   ALT 22 08/08/2019   AST 34 08/08/2019   NA 131 (L) 08/08/2019   K 4.0 08/08/2019   CL 88 (L) 08/08/2019   CREATININE 1.23 08/08/2019   BUN 12 08/08/2019  CO2 28 08/08/2019   HGBA1C 5.3 04/05/2019      Assessment & Plan:  1. Preventative health care Continue to work on eating healthy and exercise.  Check labs.  Healthy male.  - CBC with Differential/Platelet - Comprehensive metabolic panel - Lipid panel - TSH  2. Prostate cancer screening - PSA  3. Prediabetes - Hemoglobin A1c  4. Mixed hyperlipidemia Well controlled.  No changes to medicines.  Continue to work on eating a healthy diet and exercise.  Labs drawn today.  - lipid panel  5. Primary hypertension Well controlled.  No changes to medicines.  Continue to work on eating a healthy diet and exercise.  Labs drawn today.   6. Situational Anxiety: Continue valium prn.   Body mass index is 25.94 kg/m.   This is a list of the screening recommended for you and due dates:  Health Maintenance  Topic Date Due  .  Hepatitis C: One time screening is recommended by Center for Disease Control  (CDC) for  adults born from [birth year] through 1964.   Never done  . HIV Screening  Never done  . Tetanus Vaccine  06/05/2019  . Colon Cancer Screening  09/09/2022  . Flu Shot  Completed  . COVID-19 Vaccine  Completed     AN INDIVIDUALIZED CARE PLAN: was established or reinforced today.   SELF MANAGEMENT: The patient and I together assessed ways to personally work towards obtaining the recommended goals  Support needs The patient and/or family needs were assessed and services were offered if appropriate.   Follow-up: Return in about 1 year (around  04/30/2021) for fasting.Marland Kitchen  An After Visit Summary was printed and given to the patient.  Rochel Brome, MD [facility] [phone]

## 2020-04-30 NOTE — Patient Instructions (Signed)
Preventive Care 41-53 Years Old, Male Preventive care refers to lifestyle choices and visits with your health care provider that can promote health and wellness. This includes:  A yearly physical exam. This is also called an annual well check.  Regular dental and eye exams.  Immunizations.  Screening for certain conditions.  Healthy lifestyle choices, such as eating a healthy diet, getting regular exercise, not using drugs or products that contain nicotine and tobacco, and limiting alcohol use. What can I expect for my preventive care visit? Physical exam Your health care provider will check:  Height and weight. These may be used to calculate body mass index (BMI), which is a measurement that tells if you are at a healthy weight.  Heart rate and blood pressure.  Your skin for abnormal spots. Counseling Your health care provider may ask you questions about:  Alcohol, tobacco, and drug use.  Emotional well-being.  Home and relationship well-being.  Sexual activity.  Eating habits.  Work and work Statistician. What immunizations do I need?  Influenza (flu) vaccine  This is recommended every year. Tetanus, diphtheria, and pertussis (Tdap) vaccine  You may need a Td booster every 10 years. Varicella (chickenpox) vaccine  You may need this vaccine if you have not already been vaccinated. Zoster (shingles) vaccine  You may need this after age 64. Measles, mumps, and rubella (MMR) vaccine  You may need at least one dose of MMR if you were born in 1957 or later. You may also need a second dose. Pneumococcal conjugate (PCV13) vaccine  You may need this if you have certain conditions and were not previously vaccinated. Pneumococcal polysaccharide (PPSV23) vaccine  You may need one or two doses if you smoke cigarettes or if you have certain conditions. Meningococcal conjugate (MenACWY) vaccine  You may need this if you have certain conditions. Hepatitis A  vaccine  You may need this if you have certain conditions or if you travel or work in places where you may be exposed to hepatitis A. Hepatitis B vaccine  You may need this if you have certain conditions or if you travel or work in places where you may be exposed to hepatitis B. Haemophilus influenzae type b (Hib) vaccine  You may need this if you have certain risk factors. Human papillomavirus (HPV) vaccine  If recommended by your health care provider, you may need three doses over 6 months. You may receive vaccines as individual doses or as more than one vaccine together in one shot (combination vaccines). Talk with your health care provider about the risks and benefits of combination vaccines. What tests do I need? Blood tests  Lipid and cholesterol levels. These may be checked every 5 years, or more frequently if you are over 60 years old.  Hepatitis C test.  Hepatitis B test. Screening  Lung cancer screening. You may have this screening every year starting at age 43 if you have a 30-pack-year history of smoking and currently smoke or have quit within the past 15 years.  Prostate cancer screening. Recommendations will vary depending on your family history and other risks.  Colorectal cancer screening. All adults should have this screening starting at age 72 and continuing until age 2. Your health care provider may recommend screening at age 14 if you are at increased risk. You will have tests every 1-10 years, depending on your results and the type of screening test.  Diabetes screening. This is done by checking your blood sugar (glucose) after you have not eaten  for a while (fasting). You may have this done every 1-3 years.  Sexually transmitted disease (STD) testing. Follow these instructions at home: Eating and drinking  Eat a diet that includes fresh fruits and vegetables, whole grains, lean protein, and low-fat dairy products.  Take vitamin and mineral supplements as  recommended by your health care provider.  Do not drink alcohol if your health care provider tells you not to drink.  If you drink alcohol: ? Limit how much you have to 0-2 drinks a day. ? Be aware of how much alcohol is in your drink. In the [place] one drink equals one 12 oz bottle of beer (355 mL), one 5 oz glass of wine (148 mL), or one 1 oz glass of hard liquor (44 mL). Lifestyle  Take daily care of your teeth and gums.  Stay active. Exercise for at least 30 minutes on 5 or more days each week.  Do not use any products that contain nicotine or tobacco, such as cigarettes, e-cigarettes, and chewing tobacco. If you need help quitting, ask your health care provider.  If you are sexually active, practice safe sex. Use a condom or other form of protection to prevent STIs (sexually transmitted infections).  Talk with your health care provider about taking a low-dose aspirin every day starting at age 53. What's next?  Go to your health care provider once a year for a well check visit.  Ask your health care provider how often you should have your eyes and teeth checked.  Stay up to date on all vaccines. This information is not intended to replace advice given to you by your health care provider. Make sure you discuss any questions you have with your health care provider. Document Revised: 05/19/2018 Document Reviewed: 05/19/2018 Elsevier Patient Education  2020 [employer].

## 2020-05-01 LAB — CBC WITH DIFFERENTIAL/PLATELET
Basophils Absolute: 0.1 10*3/uL (ref 0.0–0.2)
Basos: 1 %
EOS (ABSOLUTE): 0.3 10*3/uL (ref 0.0–0.4)
Eos: 3 %
Hematocrit: 40.3 % (ref 37.5–51.0)
Hemoglobin: 13.9 g/dL (ref 13.0–17.7)
Immature Grans (Abs): 0 10*3/uL (ref 0.0–0.1)
Immature Granulocytes: 0 %
Lymphocytes Absolute: 2.1 10*3/uL (ref 0.7–3.1)
Lymphs: 26 %
MCH: 30.5 pg (ref 26.6–33.0)
MCHC: 34.5 g/dL (ref 31.5–35.7)
MCV: 88 fL (ref 79–97)
Monocytes Absolute: 0.8 10*3/uL (ref 0.1–0.9)
Monocytes: 10 %
Neutrophils Absolute: 4.9 10*3/uL (ref 1.4–7.0)
Neutrophils: 60 %
Platelets: 254 10*3/uL (ref 150–450)
RBC: 4.56 x10E6/uL (ref 4.14–5.80)
RDW: 12.7 % (ref 11.6–15.4)
WBC: 8.1 10*3/uL (ref 3.4–10.8)

## 2020-05-01 LAB — COMPREHENSIVE METABOLIC PANEL
ALT: 26 IU/L (ref 0–44)
AST: 35 IU/L (ref 0–40)
Albumin/Globulin Ratio: 2.4 — ABNORMAL HIGH (ref 1.2–2.2)
Albumin: 5.2 g/dL — ABNORMAL HIGH (ref 3.8–4.9)
Alkaline Phosphatase: 50 IU/L (ref 44–121)
BUN/Creatinine Ratio: 14 (ref 9–20)
BUN: 20 mg/dL (ref 6–24)
Bilirubin Total: 0.8 mg/dL (ref 0.0–1.2)
CO2: 26 mmol/L (ref 20–29)
Calcium: 10.4 mg/dL — ABNORMAL HIGH (ref 8.7–10.2)
Chloride: 89 mmol/L — ABNORMAL LOW (ref 96–106)
Creatinine, Ser: 1.39 mg/dL — ABNORMAL HIGH (ref 0.76–1.27)
GFR calc Af Amer: 66 mL/min/{1.73_m2} (ref >59)
GFR calc non Af Amer: 57 mL/min/{1.73_m2} — ABNORMAL LOW (ref >59)
Globulin, Total: 2.2 g/dL (ref 1.5–4.5)
Glucose: 96 mg/dL (ref 65–99)
Potassium: 4.5 mmol/L (ref 3.5–5.2)
Sodium: 137 mmol/L (ref 134–144)
Total Protein: 7.4 g/dL (ref 6.0–8.5)

## 2020-05-01 LAB — LIPID PANEL
Chol/HDL Ratio: 2.3 ratio (ref 0.0–5.0)
Cholesterol, Total: 195 mg/dL (ref 100–199)
HDL: 83 mg/dL (ref >39)
LDL Chol Calc (NIH): 96 mg/dL (ref 0–99)
Triglycerides: 92 mg/dL (ref 0–149)
VLDL Cholesterol Cal: 16 mg/dL (ref 5–40)

## 2020-05-01 LAB — TSH: TSH: 1.13 u[IU]/mL (ref 0.450–4.500)

## 2020-05-01 LAB — PSA: Prostate Specific Ag, Serum: 1.2 ng/mL (ref 0.0–4.0)

## 2020-05-01 LAB — CARDIOVASCULAR RISK ASSESSMENT

## 2020-05-27 DIAGNOSIS — Q251 Coarctation of aorta: Secondary | ICD-10-CM | POA: Diagnosis not present

## 2020-05-28 ENCOUNTER — Other Ambulatory Visit: Payer: Self-pay | Admitting: Family Medicine

## 2020-06-19 ENCOUNTER — Other Ambulatory Visit: Payer: Self-pay

## 2020-06-19 ENCOUNTER — Other Ambulatory Visit: Payer: BC Managed Care – PPO

## 2020-06-19 DIAGNOSIS — R944 Abnormal results of kidney function studies: Secondary | ICD-10-CM

## 2020-06-19 LAB — COMPREHENSIVE METABOLIC PANEL
ALT: 23 IU/L (ref 0–44)
AST: 27 IU/L (ref 0–40)
Albumin/Globulin Ratio: 2.5 — ABNORMAL HIGH (ref 1.2–2.2)
Albumin: 4.9 g/dL (ref 3.8–4.9)
Alkaline Phosphatase: 49 IU/L (ref 44–121)
BUN/Creatinine Ratio: 12 (ref 9–20)
BUN: 15 mg/dL (ref 6–24)
Bilirubin Total: 0.3 mg/dL (ref 0.0–1.2)
CO2: 28 mmol/L (ref 20–29)
Calcium: 9.8 mg/dL (ref 8.7–10.2)
Chloride: 91 mmol/L — ABNORMAL LOW (ref 96–106)
Creatinine, Ser: 1.22 mg/dL (ref 0.76–1.27)
GFR calc Af Amer: 78 mL/min/{1.73_m2} (ref >59)
GFR calc non Af Amer: 67 mL/min/{1.73_m2} (ref >59)
Globulin, Total: 2 g/dL (ref 1.5–4.5)
Glucose: 102 mg/dL — ABNORMAL HIGH (ref 65–99)
Potassium: 4.6 mmol/L (ref 3.5–5.2)
Sodium: 133 mmol/L — ABNORMAL LOW (ref 134–144)
Total Protein: 6.9 g/dL (ref 6.0–8.5)

## 2020-07-19 ENCOUNTER — Other Ambulatory Visit: Payer: Self-pay | Admitting: Family Medicine

## 2020-08-07 ENCOUNTER — Other Ambulatory Visit: Payer: Self-pay | Admitting: Family Medicine

## 2020-08-24 ENCOUNTER — Other Ambulatory Visit: Payer: Self-pay | Admitting: Legal Medicine

## 2020-09-12 ENCOUNTER — Other Ambulatory Visit: Payer: Self-pay | Admitting: Family Medicine

## 2021-01-27 ENCOUNTER — Other Ambulatory Visit: Payer: Self-pay | Admitting: Family Medicine

## 2021-01-29 ENCOUNTER — Other Ambulatory Visit: Payer: Self-pay | Admitting: Physician Assistant

## 2021-02-20 DIAGNOSIS — Q251 Coarctation of aorta: Secondary | ICD-10-CM | POA: Diagnosis not present

## 2021-02-20 DIAGNOSIS — I10 Essential (primary) hypertension: Secondary | ICD-10-CM | POA: Diagnosis not present

## 2021-03-13 DIAGNOSIS — L814 Other melanin hyperpigmentation: Secondary | ICD-10-CM | POA: Diagnosis not present

## 2021-03-13 DIAGNOSIS — D225 Melanocytic nevi of trunk: Secondary | ICD-10-CM | POA: Diagnosis not present

## 2021-03-13 DIAGNOSIS — D2272 Melanocytic nevi of left lower limb, including hip: Secondary | ICD-10-CM | POA: Diagnosis not present

## 2021-03-13 DIAGNOSIS — D1801 Hemangioma of skin and subcutaneous tissue: Secondary | ICD-10-CM | POA: Diagnosis not present

## 2021-05-12 NOTE — Progress Notes (Signed)
Subjective:  Patient ID: Dillon Lee, male    DOB: [birth year]  Age: 55 y.o. MRN: [id number]  Chief Complaint  Patient presents with   Annual Exam    HPI Dillon Lee is a 55 year old Caucasian male that presents for annual physical exam. He tells me that he recently noted right axillary swelling, possible a lymph node.  Encounter for general adult medical examination with abnormal findings  Physical ("At Risk" items are starred): Patient's last physical exam was 1 year ago .      SDOH Screenings   Alcohol Screen: Not on file  Depression (PHQ2-9): Low Risk    PHQ-2 Score: 0  Financial Resource Strain: Not on file  Food Insecurity: Not on file  Housing: Not on file  Physical Activity: Not on file  Social Connections: Not on file  Stress: Not on file  Tobacco Use: Not on file  Transportation Needs: Not on file    Fall Risk  05/13/2021  Falls in the past year? 0  Number falls in past yr: 0  Injury with Fall? 0  Risk for fall due to : No Fall Risks  Follow up Falls evaluation completed    Depression screen [facility] 2/9 05/13/2021 04/30/2020  Decreased Interest 0 0  Down, Depressed, Hopeless 0 0  PHQ - 2 Score 0 0  Altered sleeping 0 -  Tired, decreased energy 0 -  Change in appetite 0 -  Feeling bad or failure about yourself  0 -  Trouble concentrating 0 -  Moving slowly or fidgety/restless 0 -  Suicidal thoughts 0 -  PHQ-9 Score 0 -  Difficult doing work/chores Not difficult at all -    Functional Status Survey:     Safety: reviewed ;  Patient wears a seat belt. Patient's home has smoke detectors and carbon monoxide detectors. Patient practices appropriate gun safety Patient wears sunscreen with extended sun exposure. Dental Care: biannual cleanings, brushes and flosses daily. Ophthalmology/Optometry: Annual visit.  Hearing loss: none Vision impairments: Glasses Patient is not afflicted from Stress Incontinence and Urge Incontinence   Current Outpatient Medications  on File Prior to Visit  Medication Sig Dispense Refill   amLODipine (NORVASC) 5 MG tablet Take 5 mg by mouth daily.     allopurinol (ZYLOPRIM) 100 MG tablet TAKE 1 TABLET BY MOUTH ONCE DAILY 90 tablet 2   benazepril (LOTENSIN) 40 MG tablet Take 40 mg by mouth daily.     carvedilol (COREG) 6.25 MG tablet Take 6.25 mg by mouth 2 (two) times daily.     chlorthalidone (HYGROTON) 25 MG tablet Take 25 mg by mouth daily.     cyclobenzaprine (FLEXERIL) 5 MG tablet Take 5 mg by mouth 2 (two) times daily as needed.     diazepam (VALIUM) 5 MG tablet TAKE 1 TO 2 TABLETS BY MOUTH EVERY DAY AS NEEDED FOR AIRLINE FLIGHTS 30 tablet 0   diclofenac (FLECTOR) 1.3 % PTCH 1 patch 2 (two) times daily.     rosuvastatin (CRESTOR) 40 MG tablet TAKE 1 TABLET BY MOUTH DAILY. 90 tablet 1   tadalafil (CIALIS) 5 MG tablet TAKE 1 TABLET ONCE A DAY 90 tablet 1   traZODone (DESYREL) 50 MG tablet TAKE 1-2 TABLETS AT BEDTIME. 180 tablet 3   No current facility-administered medications on file prior to visit.    Social Hx   Social History   Socioeconomic History   Marital status: Married    Spouse name: Not on file   Number of children: Not on file  Years of education: Not on file   Highest education level: Not on file  Occupational History    Employer: [employer]  Tobacco Use   Smoking status: Never   Smokeless tobacco: Current   Tobacco comments:    occasionally  Vaping Use   Vaping Use: Never used  Substance and Sexual Activity   Alcohol use: Yes    Comment: weekly wine or beer   Drug use: No   Sexual activity: Not on file  Other Topics Concern   Not on file  Social History Narrative   Not on file   Social Determinants of Health   Financial Resource Strain: Not on file  Food Insecurity: Not on file  Transportation Needs: Not on file  Physical Activity: Not on file  Stress: Not on file  Social Connections: Not on file   Past Medical History:  Diagnosis Date   Allergy    Bradycardia,  unspecified    Gout    Heart murmur    Hyperlipidemia    Hypertension    Other male erectile dysfunction    Raynaud's disease without gangrene    Family History  Problem Relation Age of Onset   Pancreatic cancer Mother    Skin cancer Paternal Grandfather    Colon cancer Neg Hx    Esophageal cancer Neg Hx    Liver cancer Neg Hx    Rectal cancer Neg Hx    Stomach cancer Neg Hx    Prostate cancer Neg Hx     Review of Systems  Constitutional:  Negative for chills, diaphoresis, fatigue and fever.  HENT:  Negative for congestion, ear pain and sore throat.   Respiratory:  Negative for cough and shortness of breath.   Cardiovascular:  Negative for chest pain and leg swelling.  Gastrointestinal:  Negative for abdominal pain, constipation, diarrhea, nausea and vomiting.  Genitourinary:  Negative for dysuria and urgency.  Musculoskeletal:  Negative for arthralgias and myalgias.  Neurological:  Negative for dizziness and headaches.  Psychiatric/Behavioral:  Negative for dysphoric mood.     Objective:  Temp (!) 97.4 F (36.3 C)   Ht 5\' 11"  (1.803 m)   Wt 181 lb (82.1 kg)   BMI 25.24 kg/m  BP 118/78   Pulse 76   Temp (!) 97.4 F (36.3 C)   Ht 5\' 11"  (1.803 m)   Wt 181 lb (82.1 kg)   SpO2 98%   BMI 25.24 kg/m    BP/Weight 05/13/2021 40/97/3532 02/14/2425  Systolic BP - 834 196  Diastolic BP - 80 78  Wt. (Lbs) 181 186 183  BMI 25.24 25.94 25.52    Physical Exam Vitals reviewed.  Constitutional:      Appearance: Normal appearance.  HENT:     Head: Normocephalic.     Right Ear: Tympanic membrane normal.     Left Ear: Tympanic membrane normal.     Nose: Nose normal.     Mouth/Throat:     Mouth: Mucous membranes are moist.  Eyes:     Pupils: Pupils are equal, round, and reactive to light.  Cardiovascular:     Rate and Rhythm: Normal rate and regular rhythm.     Pulses: Normal pulses.     Heart sounds: Normal heart sounds.  Pulmonary:     Effort: Pulmonary effort is  normal.     Breath sounds: Normal breath sounds.  Abdominal:     General: Bowel sounds are normal.     Palpations: Abdomen is soft.  Musculoskeletal:  General: Tenderness (with palpation to right Latissimus dorsi area) present. Normal range of motion.     Right upper arm: Swelling present. No tenderness.       Arms:     Cervical back: Neck supple.     Comments: Palpable soft tissue mass to right axillary area and supraclavicular area   Skin:    General: Skin is warm and dry.     Capillary Refill: Capillary refill takes less than 2 seconds.     Comments: Soft tissue masses to right axilla and supraclavicular area  Neurological:     General: No focal deficit present.     Mental Status: He is alert and oriented to person, place, and time.  Psychiatric:        Mood and Affect: Mood normal.        Behavior: Behavior normal.   Lab Results  Component Value Date   WBC 8.1 04/30/2020   HGB 13.9 04/30/2020   HCT 40.3 04/30/2020   PLT 254 04/30/2020   GLUCOSE 102 (H) 06/19/2020   CHOL 195 04/30/2020   TRIG 92 04/30/2020   HDL 83 04/30/2020   LDLCALC 96 04/30/2020   ALT 23 06/19/2020   AST 27 06/19/2020   NA 133 (L) 06/19/2020   K 4.6 06/19/2020   CL 91 (L) 06/19/2020   CREATININE 1.22 06/19/2020   BUN 15 06/19/2020   CO2 28 06/19/2020   TSH 1.130 04/30/2020   HGBA1C 5.5 04/30/2020      Assessment & Plan:  1. Routine medical exam - allopurinol (ZYLOPRIM) 100 MG tablet; Take 1 tablet (100 mg total) by mouth daily.  Dispense: 90 tablet; Refill: 2 - benazepril (LOTENSIN) 40 MG tablet; Take 1 tablet (40 mg total) by mouth daily.  Dispense: 90 tablet; Refill: 0 - carvedilol (COREG) 6.25 MG tablet; Take 1 tablet (6.25 mg total) by mouth 2 (two) times daily.  Dispense: 180 tablet; Refill: 0 - diazepam (VALIUM) 5 MG tablet; TAKE 1 TO 2 TABLETS BY MOUTH EVERY DAY AS NEEDED FOR AIRLINE FLIGHTS  Dispense: 30 tablet; Refill: 0 - rosuvastatin (CRESTOR) 40 MG tablet; Take 1 tablet  (40 mg total) by mouth daily.  Dispense: 90 tablet; Refill: 0 - traZODone (DESYREL) 50 MG tablet; TAKE 1-2 TABLETS AT BEDTIME.  Dispense: 180 tablet; Refill: 3 - CBC with Differential/Platelet - Comprehensive metabolic panel - Lipid panel - PSA  2. Mass of soft tissue of right upper extremity - US AXILLA COMP UPPER RIGHT  Right axillary and supraclavicular soft tissue masses    We will call you with lab results and appointment for right axillary ultrasound appt    These are the goals we discussed:  Goals   Remain healthy       This is a list of the screening recommended for you and due dates:  Health Maintenance  Topic Date Due   HIV Screening  Never done   Hepatitis C Screening: USPSTF Recommendation to screen - Ages 68-79 yo.  Never done   Zoster (Shingles) Vaccine (2 of 2) 04/20/2019   Tetanus Vaccine  06/05/2019   COVID-19 Vaccine (4 - Booster for Moderna series) 05/29/2020   Flu Shot  01/06/2021   Colon Cancer Screening  09/09/2022   Pneumococcal Vaccination  Aged Out   HPV Vaccine  Aged Out      AN INDIVIDUALIZED CARE PLAN: was established or reinforced today.   SELF MANAGEMENT: The patient and I together assessed ways to personally work towards obtaining the recommended goals  Support needs The patient and/or family needs were assessed and services were offered and not necessary at this time.    Follow-up: 1-year  I, Rip Harbour, NP, have reviewed all documentation for this visit. The documentation on 05/13/21 for the exam, diagnosis, procedures, and orders are all accurate and complete.    Signed, Jerrell Belfast, [place] [phone]

## 2021-05-13 ENCOUNTER — Other Ambulatory Visit: Payer: Self-pay

## 2021-05-13 ENCOUNTER — Ambulatory Visit (INDEPENDENT_AMBULATORY_CARE_PROVIDER_SITE_OTHER): Payer: BC Managed Care – PPO | Admitting: Nurse Practitioner

## 2021-05-13 ENCOUNTER — Encounter: Payer: Self-pay | Admitting: Nurse Practitioner

## 2021-05-13 VITALS — BP 118/78 | HR 76 | Temp 97.4°F | Ht 71.0 in | Wt 181.0 lb

## 2021-05-13 DIAGNOSIS — Z0000 Encounter for general adult medical examination without abnormal findings: Secondary | ICD-10-CM | POA: Diagnosis not present

## 2021-05-13 DIAGNOSIS — M7989 Other specified soft tissue disorders: Secondary | ICD-10-CM

## 2021-05-13 MED ORDER — DIAZEPAM 5 MG PO TABS: ORAL_TABLET | ORAL | 0 refills | Status: DC

## 2021-05-13 MED ORDER — ROSUVASTATIN CALCIUM 40 MG PO TABS: 40.0000 mg | ORAL_TABLET | Freq: Every day | ORAL | 0 refills | Status: DC

## 2021-05-13 MED ORDER — ALLOPURINOL 100 MG PO TABS: 100.0000 mg | ORAL_TABLET | Freq: Every day | ORAL | 2 refills | Status: DC

## 2021-05-13 MED ORDER — BENAZEPRIL HCL 40 MG PO TABS: 40.0000 mg | ORAL_TABLET | Freq: Every day | ORAL | 0 refills | Status: DC

## 2021-05-13 MED ORDER — CARVEDILOL 6.25 MG PO TABS: 6.2500 mg | ORAL_TABLET | Freq: Two times a day (BID) | ORAL | 0 refills | Status: AC

## 2021-05-13 MED ORDER — TRAZODONE HCL 50 MG PO TABS: ORAL_TABLET | ORAL | 3 refills | Status: DC

## 2021-05-13 NOTE — Patient Instructions (Signed)

## 2021-05-14 ENCOUNTER — Encounter: Payer: Self-pay | Admitting: Nurse Practitioner

## 2021-05-14 LAB — COMPREHENSIVE METABOLIC PANEL
ALT: 24 IU/L (ref 0–44)
AST: 29 IU/L (ref 0–40)
Albumin/Globulin Ratio: 2.5 — ABNORMAL HIGH (ref 1.2–2.2)
Albumin: 5 g/dL — ABNORMAL HIGH (ref 3.8–4.9)
Alkaline Phosphatase: 41 IU/L — ABNORMAL LOW (ref 44–121)
BUN/Creatinine Ratio: 13 (ref 9–20)
BUN: 15 mg/dL (ref 6–24)
Bilirubin Total: 0.6 mg/dL (ref 0.0–1.2)
CO2: 29 mmol/L (ref 20–29)
Calcium: 10.2 mg/dL (ref 8.7–10.2)
Chloride: 85 mmol/L — ABNORMAL LOW (ref 96–106)
Creatinine, Ser: 1.13 mg/dL (ref 0.76–1.27)
Globulin, Total: 2 g/dL (ref 1.5–4.5)
Glucose: 106 mg/dL — ABNORMAL HIGH (ref 70–99)
Potassium: 4.5 mmol/L (ref 3.5–5.2)
Sodium: 127 mmol/L — ABNORMAL LOW (ref 134–144)
Total Protein: 7 g/dL (ref 6.0–8.5)
eGFR: 77 mL/min/{1.73_m2} (ref >59)

## 2021-05-14 LAB — CBC WITH DIFFERENTIAL/PLATELET
Basophils Absolute: 0 10*3/uL (ref 0.0–0.2)
Basos: 1 %
EOS (ABSOLUTE): 0.1 10*3/uL (ref 0.0–0.4)
Eos: 1 %
Hematocrit: 38.2 % (ref 37.5–51.0)
Hemoglobin: 13.4 g/dL (ref 13.0–17.7)
Immature Grans (Abs): 0 10*3/uL (ref 0.0–0.1)
Immature Granulocytes: 0 %
Lymphocytes Absolute: 2.5 10*3/uL (ref 0.7–3.1)
Lymphs: 45 %
MCH: 30 pg (ref 26.6–33.0)
MCHC: 35.1 g/dL (ref 31.5–35.7)
MCV: 86 fL (ref 79–97)
Monocytes Absolute: 0.6 10*3/uL (ref 0.1–0.9)
Monocytes: 10 %
Neutrophils Absolute: 2.4 10*3/uL (ref 1.4–7.0)
Neutrophils: 43 %
Platelets: 258 10*3/uL (ref 150–450)
RBC: 4.47 x10E6/uL (ref 4.14–5.80)
RDW: 12.1 % (ref 11.6–15.4)
WBC: 5.5 10*3/uL (ref 3.4–10.8)

## 2021-05-14 LAB — LIPID PANEL
Chol/HDL Ratio: 2.4 ratio (ref 0.0–5.0)
Cholesterol, Total: 203 mg/dL — ABNORMAL HIGH (ref 100–199)
HDL: 83 mg/dL (ref >39)
LDL Chol Calc (NIH): 106 mg/dL — ABNORMAL HIGH (ref 0–99)
Triglycerides: 80 mg/dL (ref 0–149)
VLDL Cholesterol Cal: 14 mg/dL (ref 5–40)

## 2021-05-14 LAB — CARDIOVASCULAR RISK ASSESSMENT

## 2021-05-14 LAB — PSA: Prostate Specific Ag, Serum: 1.2 ng/mL (ref 0.0–4.0)

## 2021-05-15 ENCOUNTER — Ambulatory Visit
Admission: RE | Admit: 2021-05-15 | Discharge: 2021-05-15 | Disposition: A | Discharge status: Home / Self Care | Payer: BC Managed Care – PPO | Source: Ambulatory Visit | Attending: Nurse Practitioner | Admitting: Nurse Practitioner

## 2021-05-15 DIAGNOSIS — R2231 Localized swelling, mass and lump, right upper limb: Secondary | ICD-10-CM | POA: Diagnosis not present

## 2021-05-16 ENCOUNTER — Encounter: Payer: Self-pay | Admitting: Nurse Practitioner

## 2021-05-21 ENCOUNTER — Other Ambulatory Visit: Payer: Self-pay

## 2021-05-21 ENCOUNTER — Other Ambulatory Visit: Payer: BC Managed Care – PPO

## 2021-05-21 DIAGNOSIS — M7989 Other specified soft tissue disorders: Secondary | ICD-10-CM | POA: Diagnosis not present

## 2021-05-21 DIAGNOSIS — E871 Hypo-osmolality and hyponatremia: Secondary | ICD-10-CM

## 2021-05-21 DIAGNOSIS — R591 Generalized enlarged lymph nodes: Secondary | ICD-10-CM | POA: Diagnosis not present

## 2021-05-21 LAB — COMPREHENSIVE METABOLIC PANEL
ALT: 19 IU/L (ref 0–44)
AST: 25 IU/L (ref 0–40)
Albumin/Globulin Ratio: 3.4 — ABNORMAL HIGH (ref 1.2–2.2)
Albumin: 4.8 g/dL (ref 3.8–4.9)
Alkaline Phosphatase: 36 IU/L — ABNORMAL LOW (ref 44–121)
BUN/Creatinine Ratio: 12 (ref 9–20)
BUN: 14 mg/dL (ref 6–24)
Bilirubin Total: 0.4 mg/dL (ref 0.0–1.2)
CO2: 28 mmol/L (ref 20–29)
Calcium: 9.7 mg/dL (ref 8.7–10.2)
Chloride: 98 mmol/L (ref 96–106)
Creatinine, Ser: 1.18 mg/dL (ref 0.76–1.27)
Globulin, Total: 1.4 g/dL — ABNORMAL LOW (ref 1.5–4.5)
Glucose: 100 mg/dL — ABNORMAL HIGH (ref 70–99)
Potassium: 5.3 mmol/L — ABNORMAL HIGH (ref 3.5–5.2)
Sodium: 138 mmol/L (ref 134–144)
Total Protein: 6.2 g/dL (ref 6.0–8.5)
eGFR: 73 mL/min/{1.73_m2} (ref >59)

## 2021-06-12 ENCOUNTER — Encounter: Payer: Self-pay | Admitting: Family Medicine

## 2021-06-16 ENCOUNTER — Ambulatory Visit: Payer: BC Managed Care – PPO | Admitting: Family Medicine

## 2021-06-16 ENCOUNTER — Other Ambulatory Visit: Payer: Self-pay

## 2021-06-16 ENCOUNTER — Encounter: Payer: Self-pay | Admitting: Family Medicine

## 2021-06-16 VITALS — BP 120/80 | HR 48 | Temp 97.8°F | Resp 16 | Ht 71.0 in | Wt 187.0 lb

## 2021-06-16 DIAGNOSIS — E875 Hyperkalemia: Secondary | ICD-10-CM | POA: Diagnosis not present

## 2021-06-16 DIAGNOSIS — E871 Hypo-osmolality and hyponatremia: Secondary | ICD-10-CM | POA: Diagnosis not present

## 2021-06-16 DIAGNOSIS — R590 Localized enlarged lymph nodes: Secondary | ICD-10-CM

## 2021-06-16 LAB — COMPREHENSIVE METABOLIC PANEL
ALT: 15 IU/L (ref 0–44)
AST: 20 IU/L (ref 0–40)
Albumin/Globulin Ratio: 2.9 — ABNORMAL HIGH (ref 1.2–2.2)
Albumin: 4.7 g/dL (ref 3.8–4.9)
Alkaline Phosphatase: 38 IU/L — ABNORMAL LOW (ref 44–121)
BUN/Creatinine Ratio: 12 (ref 9–20)
BUN: 14 mg/dL (ref 6–24)
Bilirubin Total: 0.6 mg/dL (ref 0.0–1.2)
CO2: 24 mmol/L (ref 20–29)
Calcium: 9.4 mg/dL (ref 8.7–10.2)
Chloride: 102 mmol/L (ref 96–106)
Creatinine, Ser: 1.13 mg/dL (ref 0.76–1.27)
Globulin, Total: 1.6 g/dL (ref 1.5–4.5)
Glucose: 106 mg/dL — ABNORMAL HIGH (ref 70–99)
Potassium: 4.5 mmol/L (ref 3.5–5.2)
Sodium: 140 mmol/L (ref 134–144)
Total Protein: 6.3 g/dL (ref 6.0–8.5)
eGFR: 77 mL/min/{1.73_m2} (ref >59)

## 2021-06-16 NOTE — Assessment & Plan Note (Signed)
Monitor. Nontender.

## 2021-06-16 NOTE — Assessment & Plan Note (Signed)
Check cmp 

## 2021-06-16 NOTE — Progress Notes (Signed)
Acute Office Visit  Subjective:    Patient ID: Dillon Lee, male    DOB: 1965/07/10, 56 y.o.   MRN: [id number]  Chief Complaint  Patient presents with   Nodule    Right armpit   Hypertension    HPI: Patient is in today for checking a nodule under the right armpit since November 2022. He mentioned that he does not have any problem with ROM. He denied redness, discharged, fever, tenderness to the touch. Sometimes pain. On 05/13/2021 patient had an ultrasound which showed : Focused ultrasound of the right axilla demonstrates a borderline enlarged lymph node measuring 1.2 cm in short axis.  Hyperkalemia  with eplerenone. Hyponatremia with chlorthalidone.  Both discontinued by cardiology.   Past Medical History:  Diagnosis Date   Allergy    Bradycardia, unspecified    Gout    Heart murmur    Hyperlipidemia    Hypertension    Other male erectile dysfunction    Raynaud's disease without gangrene     Past Surgical History:  Procedure Laterality Date   COARCTATION OF AORTA REPAIR     HERNIA REPAIR      Family History  Problem Relation Age of Onset   Pancreatic cancer Mother    Skin cancer Paternal Grandfather    Colon cancer Neg Hx    Esophageal cancer Neg Hx    Liver cancer Neg Hx    Rectal cancer Neg Hx    Stomach cancer Neg Hx    Prostate cancer Neg Hx     Social History   Socioeconomic History   Marital status: Married    Spouse name: Not on file   Number of children: Not on file   Years of education: Not on file   Highest education level: Not on file  Occupational History    Employer: [employer]  Tobacco Use   Smoking status: Never    Passive exposure: Never   Smokeless tobacco: Current    Types: Snuff   Tobacco comments:    occasionally  Vaping Use   Vaping Use: Never used  Substance and Sexual Activity   Alcohol use: Yes    Comment: weekly wine or beer   Drug use: No   Sexual activity: Yes    Partners: Female  Other Topics Concern    Not on file  Social History Narrative   Not on file   Social Determinants of Health   Financial Resource Strain: Low Risk    Difficulty of Paying Living Expenses: Not hard at all  Food Insecurity: No Food Insecurity   Worried About Charity fundraiser in the Last Year: Never true   Ran Out of Food in the Last Year: Never true  Transportation Needs: No Transportation Needs   Lack of Transportation (Medical): No   Lack of Transportation (Non-Medical): No  Physical Activity: Sufficiently Active   Days of Exercise per Week: 5 days   Minutes of Exercise per Session: 50 min  Stress: No Stress Concern Present   Feeling of Stress : Not at all  Social Connections: Moderately Integrated   Frequency of Communication with Friends and Family: Three times a week   Frequency of Social Gatherings with Friends and Family: Once a week   Attends Religious Services: 1 to 4 times per year   Active Member of [employer] or Organizations: No   Attends Archivist Meetings: Never   Marital Status: Married  Human resources officer Violence: Not At Risk   Fear of Current  or Ex-Partner: No   Emotionally Abused: No   Physically Abused: No   Sexually Abused: No    Outpatient Medications Prior to Visit  Medication Sig Dispense Refill   allopurinol (ZYLOPRIM) 100 MG tablet Take 1 tablet (100 mg total) by mouth daily. 90 tablet 2   amLODipine (NORVASC) 5 MG tablet Take 5 mg by mouth daily.     benazepril (LOTENSIN) 40 MG tablet Take 1 tablet (40 mg total) by mouth daily. 90 tablet 0   carvedilol (COREG) 6.25 MG tablet Take 1 tablet (6.25 mg total) by mouth 2 (two) times daily. 180 tablet 0   diazepam (VALIUM) 5 MG tablet TAKE 1 TO 2 TABLETS BY MOUTH EVERY DAY AS NEEDED FOR AIRLINE FLIGHTS 30 tablet 0   diclofenac (FLECTOR) 1.3 % PTCH 1 patch 2 (two) times daily.     rosuvastatin (CRESTOR) 40 MG tablet Take 1 tablet (40 mg total) by mouth daily. 90 tablet 0   tadalafil (CIALIS) 5 MG tablet TAKE 1 TABLET ONCE A  DAY 90 tablet 1   traZODone (DESYREL) 50 MG tablet TAKE 1-2 TABLETS AT BEDTIME. 180 tablet 3   chlorthalidone (HYGROTON) 25 MG tablet Take 25 mg by mouth daily.     eplerenone (INSPRA) 25 MG tablet Take 1 tablet by mouth daily. (Patient not taking: Reported on 06/16/2021)     No facility-administered medications prior to visit.    Allergies  Allergen Reactions   Amlodipine Swelling   Hydralazine Nausea Only   Avelox [Moxifloxacin]    Chlorthalidone     Hyponatremia, confusion.   Eplerenone     hyperkalemia    Review of Systems  Constitutional:  Negative for chills, fatigue, fever and unexpected weight change.  HENT:  Negative for congestion, ear pain, sinus pain and sore throat.   Respiratory:  Negative for cough and shortness of breath.   Cardiovascular:  Negative for chest pain and palpitations.  Gastrointestinal:  Negative for abdominal pain, blood in stool, constipation, diarrhea, nausea and vomiting.  Endocrine: Negative for polydipsia.  Genitourinary:  Negative for dysuria.  Musculoskeletal:  Negative for back pain.  Skin:  Negative for rash.  Neurological:  Negative for headaches.      Objective:    Physical Exam Constitutional:      Appearance: Normal appearance.  Cardiovascular:     Rate and Rhythm: Normal rate and regular rhythm.     Heart sounds: Normal heart sounds.  Pulmonary:     Effort: Pulmonary effort is normal.     Breath sounds: Normal breath sounds.  Lymphadenopathy:     Upper Body:     Right upper body: Axillary adenopathy (swelling under rt axilla. no obvious lymph node felt. nontender.) present.  Neurological:     Mental Status: He is alert.    BP 120/80    Pulse (!) 48    Temp 97.8 F (36.6 C)    Resp 16    Ht _0  (1.803 m)    Wt 187 lb (84.8 kg)    SpO2 98%    BMI 26.08 kg/m   Wt Readings from Last 3 Encounters:  06/16/21 187 lb (84.8 kg)  05/13/21 181 lb (82.1 kg)  04/30/20 186 lb (84.4 kg)    Health Maintenance Due  Topic Date  Due   Zoster Vaccines- Shingrix (2 of 2) 04/20/2019   TETANUS/TDAP  06/05/2019   COVID-19 Vaccine (4 - Booster for Moderna series) 05/29/2020    There are no preventive care reminders to  display for this patient.   Lab Results  Component Value Date   TSH 1.130 04/30/2020   Lab Results  Component Value Date   WBC 5.5 05/13/2021   HGB 13.4 05/13/2021   HCT 38.2 05/13/2021   MCV 86 05/13/2021   PLT 258 05/13/2021   Lab Results  Component Value Date   NA 138 05/21/2021   K 5.3 (H) 05/21/2021   CO2 28 05/21/2021   GLUCOSE 100 (H) 05/21/2021   BUN 14 05/21/2021   CREATININE 1.18 05/21/2021   BILITOT 0.4 05/21/2021   ALKPHOS 36 (L) 05/21/2021   AST 25 05/21/2021   ALT 19 05/21/2021   PROT 6.2 05/21/2021   ALBUMIN 4.8 05/21/2021   CALCIUM 9.7 05/21/2021   EGFR 73 05/21/2021   Lab Results  Component Value Date   CHOL 203 (H) 05/13/2021   Lab Results  Component Value Date   HDL 83 05/13/2021   Lab Results  Component Value Date   LDLCALC 106 (H) 05/13/2021   Lab Results  Component Value Date   TRIG 80 05/13/2021   Lab Results  Component Value Date   CHOLHDL 2.4 05/13/2021   Lab Results  Component Value Date   HGBA1C 5.5 04/30/2020       Assessment & Plan:   Problem List Items Addressed This Visit       Immune and Lymphatic   Axillary lymphadenopathy - Primary    Monitor. Nontender.         Other   Hyponatremia    Check cmp      Relevant Orders   Comprehensive metabolic panel   Hyperkalemia    Check cmp       Orders Placed This Encounter  Procedures   Comprehensive metabolic panel     Follow-up: Return if symptoms worsen or fail to improve if increasing.Marland Kitchen  An After Visit Summary was printed and given to the patient.  Rochel Brome, MD [facility] [phone]

## 2021-07-03 ENCOUNTER — Other Ambulatory Visit: Payer: Self-pay | Admitting: Family Medicine

## 2021-07-14 ENCOUNTER — Encounter: Payer: Self-pay | Admitting: Family Medicine

## 2021-09-03 ENCOUNTER — Encounter: Payer: Self-pay | Admitting: Family Medicine

## 2021-09-08 ENCOUNTER — Other Ambulatory Visit: Payer: Self-pay | Admitting: Nurse Practitioner

## 2021-09-08 DIAGNOSIS — Z0000 Encounter for general adult medical examination without abnormal findings: Secondary | ICD-10-CM

## 2021-09-19 ENCOUNTER — Other Ambulatory Visit: Payer: BC Managed Care – PPO

## 2021-09-19 ENCOUNTER — Other Ambulatory Visit: Payer: Self-pay

## 2021-09-19 DIAGNOSIS — E875 Hyperkalemia: Secondary | ICD-10-CM | POA: Diagnosis not present

## 2021-09-19 DIAGNOSIS — E876 Hypokalemia: Secondary | ICD-10-CM

## 2021-09-19 LAB — POTASSIUM: Potassium: 5.3 mmol/L — ABNORMAL HIGH (ref 3.5–5.2)

## 2021-10-08 ENCOUNTER — Other Ambulatory Visit: Payer: Self-pay | Admitting: Family Medicine

## 2021-10-13 ENCOUNTER — Encounter: Payer: Self-pay | Admitting: Family Medicine

## 2021-10-15 ENCOUNTER — Encounter: Payer: Self-pay | Admitting: Family Medicine

## 2021-10-15 ENCOUNTER — Other Ambulatory Visit: Payer: Self-pay | Admitting: Family Medicine

## 2021-10-15 DIAGNOSIS — Z8042 Family history of malignant neoplasm of prostate: Secondary | ICD-10-CM

## 2021-10-30 ENCOUNTER — Other Ambulatory Visit: Payer: Self-pay | Admitting: Nurse Practitioner

## 2021-10-30 DIAGNOSIS — Z0000 Encounter for general adult medical examination without abnormal findings: Secondary | ICD-10-CM

## 2021-11-14 ENCOUNTER — Encounter: Payer: Self-pay | Admitting: Family Medicine

## 2021-11-18 ENCOUNTER — Other Ambulatory Visit: Payer: Self-pay | Admitting: Family Medicine

## 2021-11-18 MED ORDER — LEVAMLODIPINE MALEATE 5 MG PO TABS: 5.0000 mg | ORAL_TABLET | Freq: Every day | ORAL | 2 refills | Status: DC

## 2021-11-19 ENCOUNTER — Telehealth: Payer: Self-pay

## 2021-11-19 NOTE — Telephone Encounter (Signed)
Dillon Lee with [facility] called stating they currently do not carry conjupri, I informed patient that he will need to contact a different pharmacy to if they carry this medication. Once he finds a pharmacy that does to let our office know and we will send it there. Patient aware also aware that if he can not find it to also make [place] aware.

## 2021-11-25 DIAGNOSIS — Z8042 Family history of malignant neoplasm of prostate: Secondary | ICD-10-CM | POA: Insufficient documentation

## 2021-11-25 DIAGNOSIS — N401 Enlarged prostate with lower urinary tract symptoms: Secondary | ICD-10-CM | POA: Diagnosis not present

## 2021-11-25 DIAGNOSIS — R350 Frequency of micturition: Secondary | ICD-10-CM | POA: Diagnosis not present

## 2021-11-25 DIAGNOSIS — N5201 Erectile dysfunction due to arterial insufficiency: Secondary | ICD-10-CM | POA: Diagnosis not present

## 2021-11-27 ENCOUNTER — Other Ambulatory Visit: Payer: Self-pay | Admitting: Family Medicine

## 2021-12-24 ENCOUNTER — Other Ambulatory Visit: Payer: Self-pay

## 2021-12-24 ENCOUNTER — Telehealth: Payer: Self-pay

## 2021-12-24 DIAGNOSIS — I10 Essential (primary) hypertension: Secondary | ICD-10-CM

## 2021-12-24 NOTE — Telephone Encounter (Signed)
Attempted to call patient to schedule lab appointment. No answer, left VM for return call.   We received fax from Dr Mina Marble requesting BMP with essential hypertension diagnosis. This needs to be faxed to [phone]. Future order placed.   Dillon Lee, [place] 12/24/21 2:11 PM

## 2022-01-05 ENCOUNTER — Other Ambulatory Visit: Payer: Self-pay | Admitting: Family Medicine

## 2022-01-08 ENCOUNTER — Other Ambulatory Visit: Payer: BC Managed Care – PPO

## 2022-01-08 DIAGNOSIS — I10 Essential (primary) hypertension: Secondary | ICD-10-CM

## 2022-01-08 LAB — BASIC METABOLIC PANEL
BUN/Creatinine Ratio: 12 (ref 9–20)
BUN: 12 mg/dL (ref 6–24)
CO2: 27 mmol/L (ref 20–29)
Calcium: 9.6 mg/dL (ref 8.7–10.2)
Chloride: 88 mmol/L — ABNORMAL LOW (ref 96–106)
Creatinine, Ser: 1.03 mg/dL (ref 0.76–1.27)
Glucose: 98 mg/dL (ref 70–99)
Potassium: 4.6 mmol/L (ref 3.5–5.2)
Sodium: 131 mmol/L — ABNORMAL LOW (ref 134–144)
eGFR: 86 mL/min/{1.73_m2} (ref >59)

## 2022-01-11 ENCOUNTER — Encounter: Payer: Self-pay | Admitting: Family Medicine

## 2022-01-16 ENCOUNTER — Other Ambulatory Visit: Payer: Self-pay | Admitting: Family Medicine

## 2022-01-16 ENCOUNTER — Encounter: Payer: Self-pay | Admitting: Family Medicine

## 2022-01-16 MED ORDER — VALSARTAN 160 MG PO TABS: 160.0000 mg | ORAL_TABLET | Freq: Every day | ORAL | 0 refills | Status: DC

## 2022-01-19 ENCOUNTER — Other Ambulatory Visit: Payer: Self-pay | Admitting: Nurse Practitioner

## 2022-01-19 DIAGNOSIS — Z0000 Encounter for general adult medical examination without abnormal findings: Secondary | ICD-10-CM

## 2022-01-26 DIAGNOSIS — M654 Radial styloid tenosynovitis [de Quervain]: Secondary | ICD-10-CM | POA: Diagnosis not present

## 2022-02-13 ENCOUNTER — Other Ambulatory Visit: Payer: Self-pay | Admitting: Nurse Practitioner

## 2022-02-13 DIAGNOSIS — Z0000 Encounter for general adult medical examination without abnormal findings: Secondary | ICD-10-CM

## 2022-03-16 DIAGNOSIS — M6740 Ganglion, unspecified site: Secondary | ICD-10-CM | POA: Diagnosis not present

## 2022-03-16 DIAGNOSIS — D225 Melanocytic nevi of trunk: Secondary | ICD-10-CM | POA: Diagnosis not present

## 2022-03-16 DIAGNOSIS — D2361 Other benign neoplasm of skin of right upper limb, including shoulder: Secondary | ICD-10-CM | POA: Diagnosis not present

## 2022-03-16 DIAGNOSIS — D1801 Hemangioma of skin and subcutaneous tissue: Secondary | ICD-10-CM | POA: Diagnosis not present

## 2022-03-16 DIAGNOSIS — L814 Other melanin hyperpigmentation: Secondary | ICD-10-CM | POA: Diagnosis not present

## 2022-03-16 DIAGNOSIS — D485 Neoplasm of uncertain behavior of skin: Secondary | ICD-10-CM | POA: Diagnosis not present

## 2022-04-03 ENCOUNTER — Other Ambulatory Visit: Payer: Self-pay | Admitting: Nurse Practitioner

## 2022-04-03 DIAGNOSIS — Z0000 Encounter for general adult medical examination without abnormal findings: Secondary | ICD-10-CM

## 2022-04-17 IMAGING — US US EXTREM UP *R* LTD
1 series · 12 of 12 positions shown · non-contrast
Comparison: None.

CLINICAL DATA: Right axillary and supraclavicular soft tissue
masses.

EXAM:
ULTRASOUND RIGHT UPPER EXTREMITY LIMITED
TECHNIQUE: Ultrasound examination of the upper extremity soft tissues was
performed in the area of clinical concern.

[Series 1: us extrem up *right* ltd · 0.09mm/px · 12 of 12 slices shown]
[im 1/12]
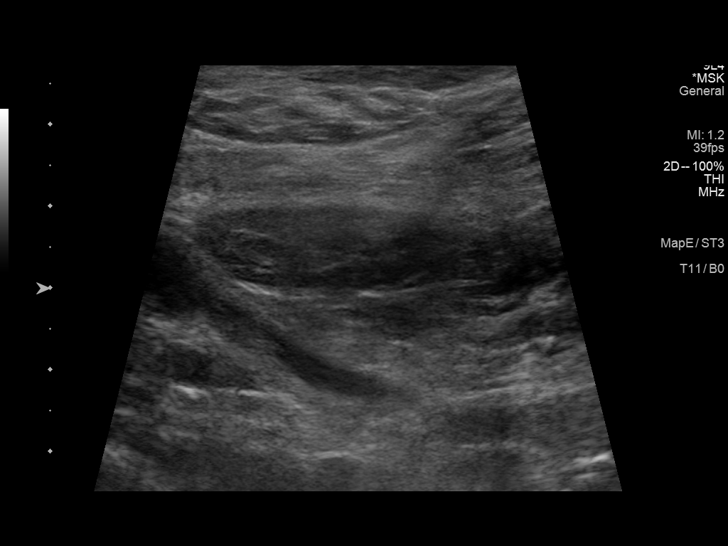
[im 2/12]
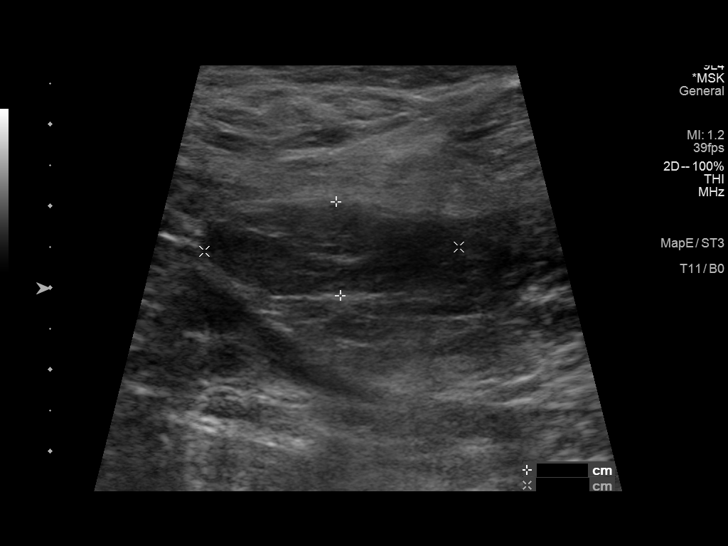
[im 3/12]
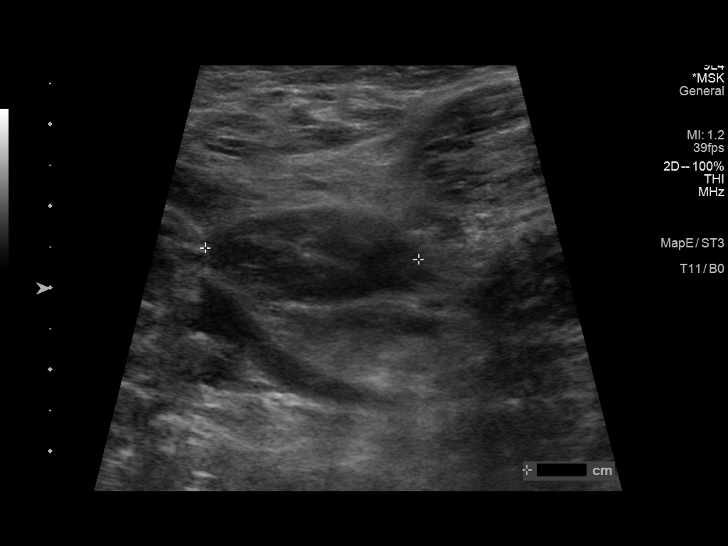
[im 4/12]
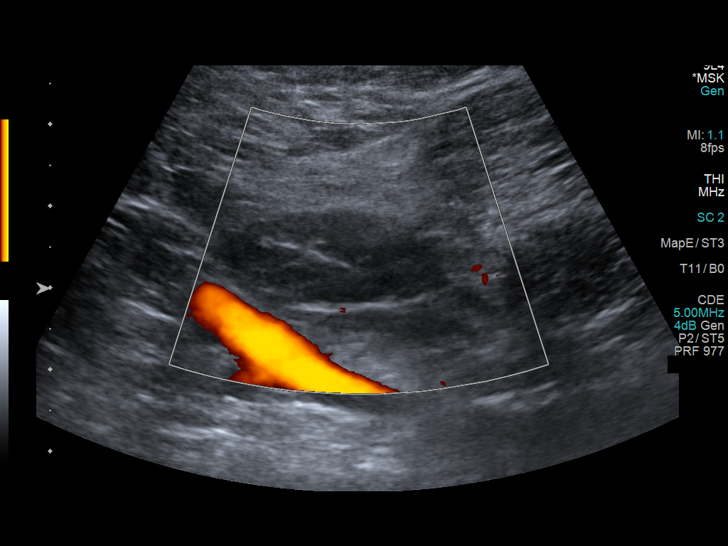
[im 5/12]
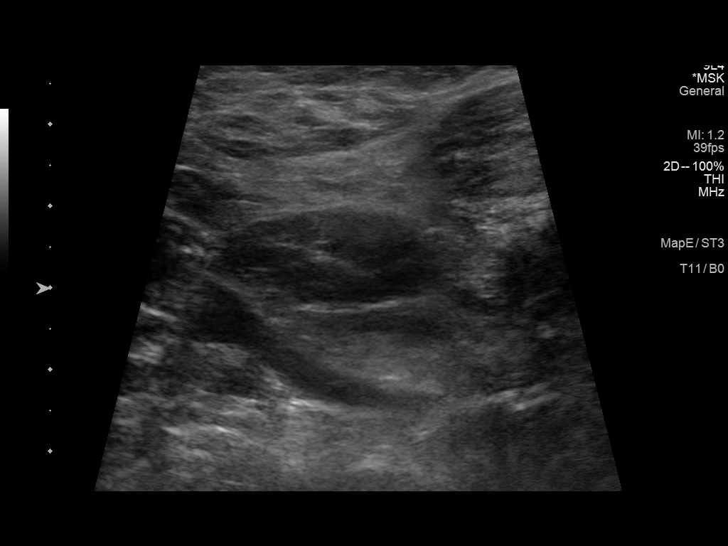
[im 6/12]
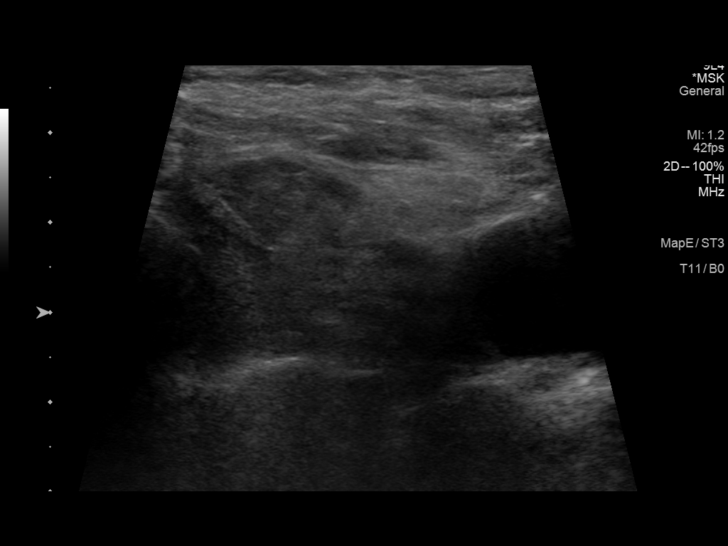
[im 7/12]
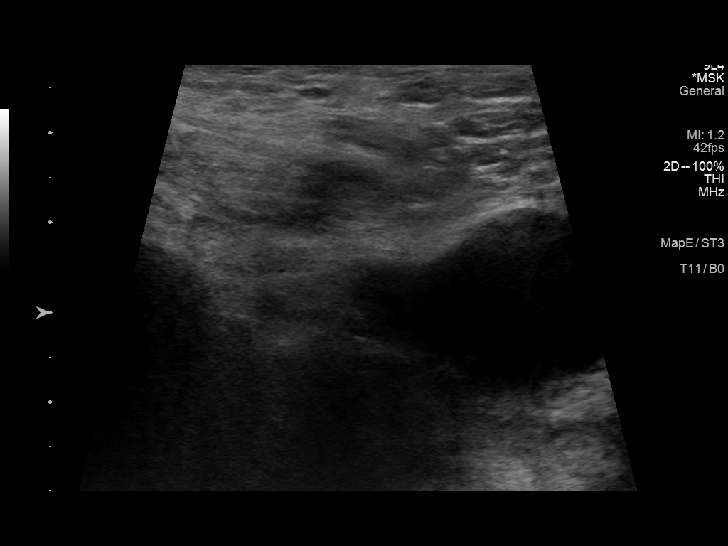
[im 8/12]
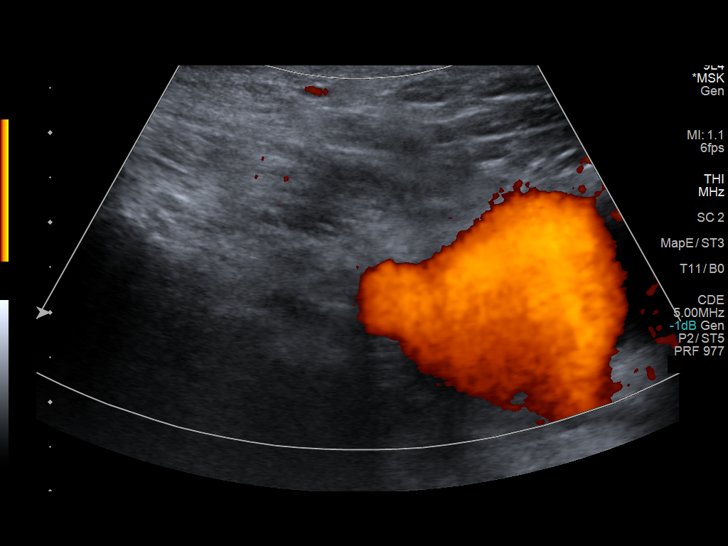
[im 9/12]
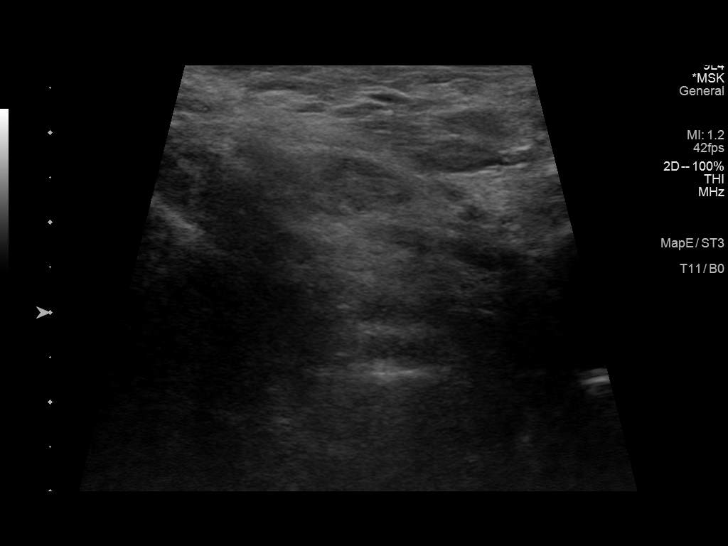
[im 10/12]
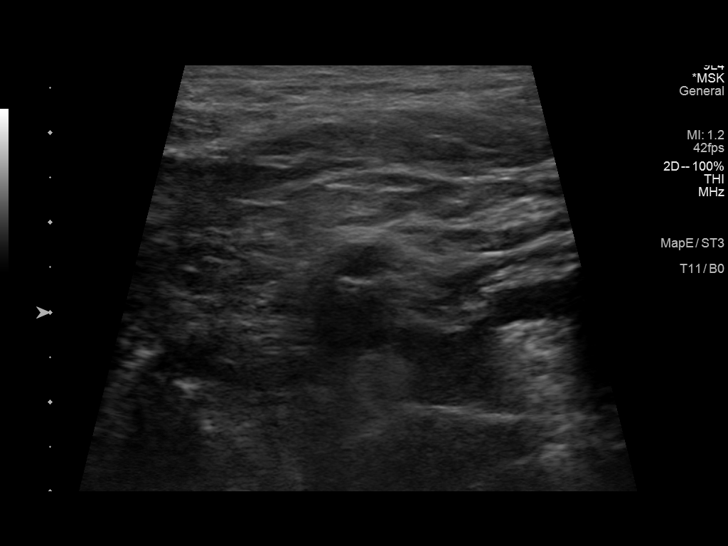
[im 11/12]
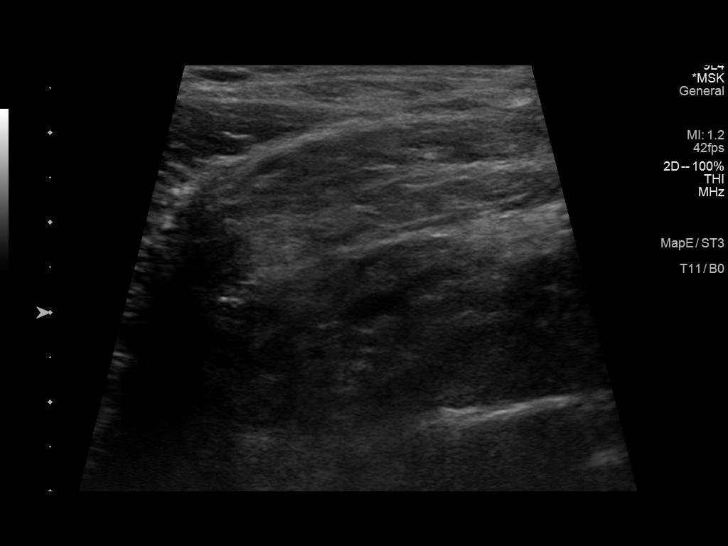
[im 12/12]
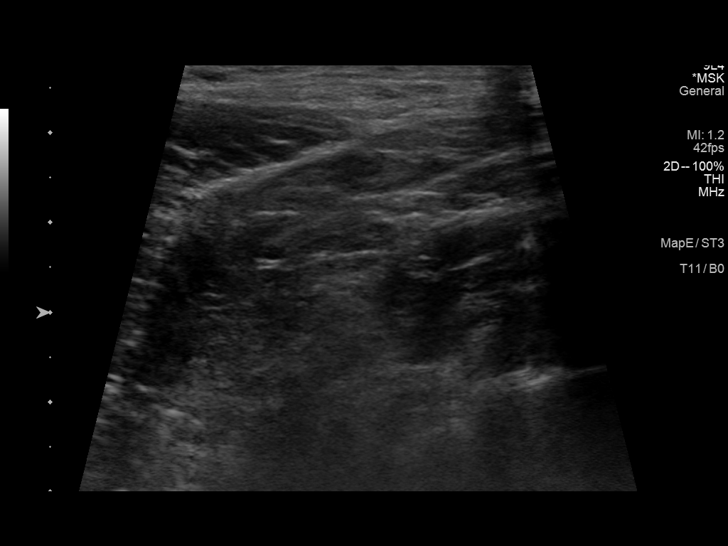

[12 of 12 positions shown; findings below may reference images not displayed]

FINDINGS: Focused ultrasound of the right axilla demonstrates a borderline
enlarged lymph node measuring 1.2 cm in short axis.

Focused ultrasound right supraclavicular region demonstrates no
discrete mass or fluid collection.
IMPRESSION: 1. Palpable abnormality in the right axilla corresponds to a
borderline enlarged right axillary lymph node, likely reactive.
2. No sonographic correlate in the right supraclavicular area of
swelling.

## 2022-04-21 DIAGNOSIS — Q251 Coarctation of aorta: Secondary | ICD-10-CM | POA: Diagnosis not present

## 2022-04-21 DIAGNOSIS — I2510 Atherosclerotic heart disease of native coronary artery without angina pectoris: Secondary | ICD-10-CM | POA: Diagnosis not present

## 2022-04-21 DIAGNOSIS — I10 Essential (primary) hypertension: Secondary | ICD-10-CM | POA: Diagnosis not present

## 2022-05-05 DIAGNOSIS — D171 Benign lipomatous neoplasm of skin and subcutaneous tissue of trunk: Secondary | ICD-10-CM | POA: Diagnosis not present

## 2022-05-11 ENCOUNTER — Other Ambulatory Visit: Payer: Self-pay | Admitting: Family Medicine

## 2022-05-11 DIAGNOSIS — Z0000 Encounter for general adult medical examination without abnormal findings: Secondary | ICD-10-CM

## 2022-05-14 ENCOUNTER — Other Ambulatory Visit: Payer: Self-pay | Admitting: Nurse Practitioner

## 2022-05-14 DIAGNOSIS — Z0000 Encounter for general adult medical examination without abnormal findings: Secondary | ICD-10-CM

## 2022-05-19 ENCOUNTER — Encounter: Payer: Self-pay | Admitting: Family Medicine

## 2022-05-19 ENCOUNTER — Ambulatory Visit (INDEPENDENT_AMBULATORY_CARE_PROVIDER_SITE_OTHER): Payer: BC Managed Care – PPO | Admitting: Family Medicine

## 2022-05-19 VITALS — BP 128/68 | HR 48 | Temp 98.0°F | Resp 14 | Ht 71.0 in | Wt 183.4 lb

## 2022-05-19 DIAGNOSIS — R7303 Prediabetes: Secondary | ICD-10-CM

## 2022-05-19 DIAGNOSIS — E782 Mixed hyperlipidemia: Secondary | ICD-10-CM | POA: Diagnosis not present

## 2022-05-19 DIAGNOSIS — I10 Essential (primary) hypertension: Secondary | ICD-10-CM

## 2022-05-19 DIAGNOSIS — Z125 Encounter for screening for malignant neoplasm of prostate: Secondary | ICD-10-CM

## 2022-05-19 DIAGNOSIS — R590 Localized enlarged lymph nodes: Secondary | ICD-10-CM

## 2022-05-19 DIAGNOSIS — R2231 Localized swelling, mass and lump, right upper limb: Secondary | ICD-10-CM

## 2022-05-19 DIAGNOSIS — Z0001 Encounter for general adult medical examination with abnormal findings: Secondary | ICD-10-CM

## 2022-05-19 NOTE — Progress Notes (Signed)
Subjective:  Patient ID: Dillon Lee, male    DOB: [birth year]  Age: 56 y.o. MRN: [id number]  Chief Complaint  Patient presents with   Annual Exam    HPI  Well Adult Physical: Patient here for a comprehensive physical exam.The patient reports  questionable lipoma right axilla.   Do you take any herbs or supplements that were not prescribed by a doctor? Yes, beet root.   Are you taking calcium supplements? no Are you taking aspirin daily? no  Encounter for general adult medical examination without abnormal findings  Physical ("At Risk" items are starred): Patient's last physical exam was 1 year ago .  Patient wears a seat belt, has smoke detectors, has carbon monoxide detectors, practices appropriate gun safety, and wears sunscreen with extended sun exposure. Dental Care: biannual cleanings, brushes and flosses daily. Ophthalmology/Optometry: Annual visit.  Hearing loss: none Vision impairments: none Eating healthy.  Exercise: poor.   [place] Office Visit from 05/19/2022 in [facility]  PHQ-2 Total Score 0          Social History   Socioeconomic History   Marital status: Married    Spouse name: Not on file   Number of children: Not on file   Years of education: Not on file   Highest education level: Not on file  Occupational History    Employer: [employer]  Tobacco Use   Smoking status: Never    Passive exposure: Never   Smokeless tobacco: Former    Types: Snuff   Tobacco comments:    occasionally  Vaping Use   Vaping Use: Never used  Substance and Sexual Activity   Alcohol use: Yes    Comment: weekly wine or beer   Drug use: No   Sexual activity: Yes    Partners: Female  Other Topics Concern   Not on file  Social History Narrative   Not on file   Social Determinants of Health   Financial Resource Strain: Low Risk  (05/19/2022)   Overall Financial Resource Strain (CARDIA)    Difficulty of Paying Living Expenses: Not hard at all  Food  Insecurity: No Food Insecurity (05/19/2022)   Hunger Vital Sign    Worried About Running Out of Food in the Last Year: Never true    [place] in the Last Year: Never true  Transportation Needs: No Transportation Needs (05/13/2021)   PRAPARE - Hydrologist (Medical): No    Lack of Transportation (Non-Medical): No  Physical Activity: Sufficiently Active (05/19/2022)   Exercise Vital Sign    Days of Exercise per Week: 5 days    Minutes of Exercise per Session: 50 min  Stress: No Stress Concern Present (05/19/2022)   [place]    Feeling of Stress : Only a little  Social Connections: Socially Integrated (05/19/2022)   Social Connection and Isolation Panel [NHANES]    Frequency of Communication with Friends and Family: More than three times a week    Frequency of Social Gatherings with Friends and Family: Once a week    Attends Religious Services: More than 4 times per year    Active Member of [employer] or Organizations: Yes    Attends Music therapist: More than 4 times per year    Marital Status: Married   Past Medical History:  Diagnosis Date   Allergy    Bradycardia, unspecified    Gout    Heart murmur  Hyperlipidemia    Hypertension    Other male erectile dysfunction    Raynaud's disease without gangrene    Past Surgical History:  Procedure Laterality Date   COARCTATION OF AORTA REPAIR     HERNIA REPAIR      Family History  Problem Relation Age of Onset   Pancreatic cancer Mother    Skin cancer Paternal Grandfather    Colon cancer Neg Hx    Esophageal cancer Neg Hx    Liver cancer Neg Hx    Rectal cancer Neg Hx    Stomach cancer Neg Hx    Prostate cancer Neg Hx    Social History   Socioeconomic History   Marital status: Married    Spouse name: Not on file   Number of children: Not on file   Years of education: Not on file   Highest education level:  Not on file  Occupational History    Employer: [employer]  Tobacco Use   Smoking status: Never    Passive exposure: Never   Smokeless tobacco: Former    Types: Snuff   Tobacco comments:    occasionally  Vaping Use   Vaping Use: Never used  Substance and Sexual Activity   Alcohol use: Yes    Comment: weekly wine or beer   Drug use: No   Sexual activity: Yes    Partners: Female  Other Topics Concern   Not on file  Social History Narrative   Not on file   Social Determinants of Health   Financial Resource Strain: Low Risk  (05/19/2022)   Overall Financial Resource Strain (CARDIA)    Difficulty of Paying Living Expenses: Not hard at all  Food Insecurity: No Food Insecurity (05/19/2022)   Hunger Vital Sign    Worried About Running Out of Food in the Last Year: Never true    Ran Out of Food in the Last Year: Never true  Transportation Needs: No Transportation Needs (05/13/2021)   PRAPARE - Hydrologist (Medical): No    Lack of Transportation (Non-Medical): No  Physical Activity: Sufficiently Active (05/19/2022)   Exercise Vital Sign    Days of Exercise per Week: 5 days    Minutes of Exercise per Session: 50 min  Stress: No Stress Concern Present (05/19/2022)   [place]    Feeling of Stress : Only a little  Social Connections: Socially Integrated (05/19/2022)   Social Connection and Isolation Panel [NHANES]    Frequency of Communication with Friends and Family: More than three times a week    Frequency of Social Gatherings with Friends and Family: Once a week    Attends Religious Services: More than 4 times per year    Active Member of [employer] or Organizations: Yes    Attends Music therapist: More than 4 times per year    Marital Status: Married   Review of Systems  Constitutional:  Negative for chills, fatigue and fever.  HENT:  Negative for congestion, ear pain  and sore throat.   Respiratory:  Negative for cough and shortness of breath.   Cardiovascular:  Negative for chest pain and leg swelling.  Gastrointestinal:  Negative for abdominal pain, constipation, diarrhea, nausea and vomiting.  Endocrine: Negative for polydipsia, polyphagia and polyuria.  Genitourinary:  Negative for dysuria, enuresis and frequency.  Musculoskeletal:  Negative for arthralgias and myalgias.  Neurological:  Negative for dizziness and headaches.  Psychiatric/Behavioral:  Negative for  dysphoric mood.        No dysphoria     Objective:  BP 128/68   Pulse (!) 48   Temp 98 F (36.7 C)   Resp 14   Ht '5\' 11"'$  (1.803 m)   Wt 183 lb 6.4 oz (83.2 kg)   BMI 25.58 kg/m      05/19/2022    8:57 AM 06/16/2021    8:09 AM 05/13/2021    8:16 AM  BP/Weight  Systolic BP 350 093 818  Diastolic BP 68 80 78  Wt. (Lbs) 183.4 187 181  BMI 25.58 kg/m2 26.08 kg/m2 25.24 kg/m2    Physical Exam Vitals reviewed.  Constitutional:      Appearance: Normal appearance.  HENT:     Right Ear: Tympanic membrane, ear canal and external ear normal.     Left Ear: Tympanic membrane, ear canal and external ear normal.     Nose: Nose normal. No congestion or rhinorrhea.     Mouth/Throat:     Mouth: Mucous membranes are moist.     Pharynx: No oropharyngeal exudate or posterior oropharyngeal erythema.  Neck:     Vascular: No carotid bruit.  Cardiovascular:     Rate and Rhythm: Normal rate and regular rhythm.     Heart sounds: Murmur heard.  Pulmonary:     Effort: Pulmonary effort is normal. No respiratory distress.     Breath sounds: Normal breath sounds. No wheezing, rhonchi or rales.  Abdominal:     General: Bowel sounds are normal.     Palpations: Abdomen is soft.     Tenderness: There is no abdominal tenderness.  Lymphadenopathy:     Cervical: No cervical adenopathy.  Neurological:     Mental Status: He is alert.  Psychiatric:        Mood and Affect: Mood normal.         Behavior: Behavior normal.     Lab Results  Component Value Date   WBC 8.4 05/19/2022   HGB 13.7 05/19/2022   HCT 40.0 05/19/2022   PLT 212 05/19/2022   GLUCOSE 94 05/19/2022   CHOL 177 05/19/2022   TRIG 80 05/19/2022   HDL 82 05/19/2022   LDLCALC 80 05/19/2022   ALT 25 05/19/2022   AST 28 05/19/2022   NA 147 (H) 05/19/2022   K 5.5 (H) 05/19/2022   CL 107 (H) 05/19/2022   CREATININE 1.13 05/19/2022   BUN 11 05/19/2022   CO2 24 05/19/2022   TSH 0.978 05/19/2022   HGBA1C 5.6 05/19/2022      Assessment & Plan:   Problem List Items Addressed This Visit       Cardiovascular and Mediastinum   Hypertension    Continue carvedilol 6.25 mg twice daily, amlodipine 5 mg daily, and lisinopril 40 mg twice daily.       Relevant Medications   amLODipine (NORVASC) 5 MG tablet   benazepril (LOTENSIN) 40 MG tablet   eplerenone (INSPRA) 25 MG tablet   Other Relevant Orders   Cardiovascular Risk Assessment (Completed)     Immune and Lymphatic   Axillary lymphadenopathy    Refer to general surgery for mass in axilla.        Other   Encounter for general adult medical examination with abnormal findings - Primary    Things to do to keep yourself healthy  - Exercise at least 30-45 minutes a day, 3-4 days a week.  - Eat a low-fat diet with lots of fruits and vegetables, up  to 7-9 servings per day.  - Seatbelts can save your life. Wear them always.  - Smoke detectors on every level of your home, check batteries every year.  - Eye Doctor - have an eye exam every 1-2 years  - Safe sex - if you may be exposed to STDs, use a condom.  - Alcohol -  If you drink, do it moderately, less than 2 drinks per day.  - [place]. Choose someone to speak for you if you are not able.  - Depression is common in our stressful world.If you're feeling down or losing interest in things you normally enjoy, please come in for a visit.  - Violence - If anyone is threatening or hurting  you, please call immediately.      Relevant Orders   CBC with Differential/Platelet (Completed)   Comprehensive metabolic panel (Completed)   Hemoglobin A1c (Completed)   Lipid panel (Completed)   TSH (Completed)   PSA (Completed)   Prediabetes    Recommend continue to work on eating healthy diet and exercise.       Relevant Orders   Hemoglobin A1c (Completed)   Mixed hyperlipidemia    Well controlled.  No changes to medicines. Continue rosuvastatin 40 mg before bed.  Continue to work on eating a healthy diet and exercise.  Labs drawn today. ]      Relevant Medications   amLODipine (NORVASC) 5 MG tablet   benazepril (LOTENSIN) 40 MG tablet   Omega-3 Fatty Acids (FISH OIL) 1000 MG CAPS   eplerenone (INSPRA) 25 MG tablet   Other Relevant Orders   Lipid panel (Completed)   Prostate cancer screening   Relevant Orders   PSA (Completed)   Mass of right axilla    Refer to general surgery for mass in axilla.      Relevant Orders   Ambulatory referral to General Surgery    Body mass index is 25.58 kg/m.   This is a list of the screening recommended for you and due dates:  Health Maintenance  Topic Date Due   COVID-19 Vaccine (4 - 2023-24 season) 02/06/2022   Colon Cancer Screening  09/09/2022   DTaP/Tdap/Td vaccine (3 - Td or Tdap) 07/15/2031   Flu Shot  Completed   Zoster (Shingles) Vaccine  Completed   HPV Vaccine  Aged Out   Hepatitis C Screening: USPSTF Recommendation to screen - Ages 32-79 yo.  Discontinued   HIV Screening  Discontinued     No orders of the defined types were placed in this encounter.  Follow-up: Return in about 1 year (around 05/20/2023) for cpe fasting.  An After Visit Summary was printed and given to the patient.  Rochel Brome, MD [facility] [phone]

## 2022-05-19 NOTE — Patient Instructions (Addendum)
Things to do to keep yourself healthy  - Exercise at least 30-45 minutes a day, 3-4 days a week.  - Eat a low-fat diet with lots of fruits and vegetables, up to 7-9 servings per day.  - Seatbelts can save your life. Wear them always.  - Smoke detectors on every level of your home, check batteries every year.  - Eye Doctor - have an eye exam every 1-2 years  - Safe sex - if you may be exposed to STDs, use a condom.  - Alcohol -  If you drink, do it moderately, less than 2 drinks per day.  - [place]. Choose someone to speak for you if you are not able.  - Depression is common in our stressful world.If you're feeling down or losing interest in things you normally enjoy, please come in for a visit.  - Violence - If anyone is threatening or hurting you, please call immediately.   Refer to general surgery for mass in axilla. Based on Labs may need to be seen sooner.

## 2022-05-20 LAB — LIPID PANEL
Chol/HDL Ratio: 2.2 ratio (ref 0.0–5.0)
Cholesterol, Total: 177 mg/dL (ref 100–199)
HDL: 82 mg/dL (ref >39)
LDL Chol Calc (NIH): 80 mg/dL (ref 0–99)
Triglycerides: 80 mg/dL (ref 0–149)
VLDL Cholesterol Cal: 15 mg/dL (ref 5–40)

## 2022-05-20 LAB — COMPREHENSIVE METABOLIC PANEL
ALT: 25 IU/L (ref 0–44)
AST: 28 IU/L (ref 0–40)
Albumin/Globulin Ratio: 2.5 — ABNORMAL HIGH (ref 1.2–2.2)
Albumin: 4.7 g/dL (ref 3.8–4.9)
Alkaline Phosphatase: 42 IU/L — ABNORMAL LOW (ref 44–121)
BUN/Creatinine Ratio: 10 (ref 9–20)
BUN: 11 mg/dL (ref 6–24)
Bilirubin Total: 0.4 mg/dL (ref 0.0–1.2)
CO2: 24 mmol/L (ref 20–29)
Calcium: 9.6 mg/dL (ref 8.7–10.2)
Chloride: 107 mmol/L — ABNORMAL HIGH (ref 96–106)
Creatinine, Ser: 1.13 mg/dL (ref 0.76–1.27)
Globulin, Total: 1.9 g/dL (ref 1.5–4.5)
Glucose: 94 mg/dL (ref 70–99)
Potassium: 5.5 mmol/L — ABNORMAL HIGH (ref 3.5–5.2)
Sodium: 147 mmol/L — ABNORMAL HIGH (ref 134–144)
Total Protein: 6.6 g/dL (ref 6.0–8.5)
eGFR: 77 mL/min/{1.73_m2} (ref >59)

## 2022-05-20 LAB — HEMOGLOBIN A1C
Est. average glucose Bld gHb Est-mCnc: 114 mg/dL
Hgb A1c MFr Bld: 5.6 % (ref 4.8–5.6)

## 2022-05-20 LAB — CBC WITH DIFFERENTIAL/PLATELET
Basophils Absolute: 0 10*3/uL (ref 0.0–0.2)
Basos: 0 %
EOS (ABSOLUTE): 0.1 10*3/uL (ref 0.0–0.4)
Eos: 1 %
Hematocrit: 40 % (ref 37.5–51.0)
Hemoglobin: 13.7 g/dL (ref 13.0–17.7)
Immature Grans (Abs): 0 10*3/uL (ref 0.0–0.1)
Immature Granulocytes: 0 %
Lymphocytes Absolute: 2.1 10*3/uL (ref 0.7–3.1)
Lymphs: 25 %
MCH: 30.7 pg (ref 26.6–33.0)
MCHC: 34.3 g/dL (ref 31.5–35.7)
MCV: 90 fL (ref 79–97)
Monocytes Absolute: 0.6 10*3/uL (ref 0.1–0.9)
Monocytes: 8 %
Neutrophils Absolute: 5.6 10*3/uL (ref 1.4–7.0)
Neutrophils: 66 %
Platelets: 212 10*3/uL (ref 150–450)
RBC: 4.46 x10E6/uL (ref 4.14–5.80)
RDW: 12.4 % (ref 11.6–15.4)
WBC: 8.4 10*3/uL (ref 3.4–10.8)

## 2022-05-20 LAB — TSH: TSH: 0.978 u[IU]/mL (ref 0.450–4.500)

## 2022-05-20 LAB — PSA: Prostate Specific Ag, Serum: 1.4 ng/mL (ref 0.0–4.0)

## 2022-05-20 LAB — CARDIOVASCULAR RISK ASSESSMENT

## 2022-05-20 NOTE — Progress Notes (Signed)
Blood count normal.  Liver function normal.  Kidney function normal.  Potassium elevated to 5.5. This can be caused by the eplerenone (inspra.) recommend discontinue. Continue amlodipine, carvedilol, and lisinopril. Patient previously tried valsartan. I believe he was on valsartan and then was switched to lisinopril.  Recommend start on telmisartan 80 mg daily.  Thyroid function normal.  Cholesterol: at goal. HBA1C: 5.6. PSA normal.

## 2022-05-24 DIAGNOSIS — E782 Mixed hyperlipidemia: Secondary | ICD-10-CM | POA: Insufficient documentation

## 2022-05-24 DIAGNOSIS — R2231 Localized swelling, mass and lump, right upper limb: Secondary | ICD-10-CM | POA: Insufficient documentation

## 2022-05-24 DIAGNOSIS — R7303 Prediabetes: Secondary | ICD-10-CM | POA: Insufficient documentation

## 2022-05-24 DIAGNOSIS — Z125 Encounter for screening for malignant neoplasm of prostate: Secondary | ICD-10-CM | POA: Insufficient documentation

## 2022-05-24 DIAGNOSIS — Z0001 Encounter for general adult medical examination with abnormal findings: Secondary | ICD-10-CM | POA: Insufficient documentation

## 2022-05-24 NOTE — Assessment & Plan Note (Signed)
Recommend continue to work on eating healthy diet and exercise.  

## 2022-05-24 NOTE — Assessment & Plan Note (Signed)
Refer to general surgery for mass in axilla.

## 2022-05-24 NOTE — Assessment & Plan Note (Signed)
Continue carvedilol 6.25 mg twice daily, amlodipine 5 mg daily, and lisinopril 40 mg twice daily.

## 2022-05-24 NOTE — Assessment & Plan Note (Signed)

## 2022-05-24 NOTE — Assessment & Plan Note (Addendum)
Well controlled.  No changes to medicines. Continue rosuvastatin 40 mg before bed.  Continue to work on eating a healthy diet and exercise.  Labs drawn today. ]

## 2022-06-09 ENCOUNTER — Encounter: Payer: Self-pay | Admitting: Family Medicine

## 2022-06-10 ENCOUNTER — Encounter: Payer: Self-pay | Admitting: Family Medicine

## 2022-06-16 ENCOUNTER — Other Ambulatory Visit: Payer: Self-pay

## 2022-06-16 ENCOUNTER — Other Ambulatory Visit: Payer: BC Managed Care – PPO

## 2022-06-16 DIAGNOSIS — E875 Hyperkalemia: Secondary | ICD-10-CM

## 2022-06-17 LAB — COMPREHENSIVE METABOLIC PANEL
ALT: 39 IU/L (ref 0–44)
AST: 33 IU/L (ref 0–40)
Albumin/Globulin Ratio: 2.3 — ABNORMAL HIGH (ref 1.2–2.2)
Albumin: 4.5 g/dL (ref 3.8–4.9)
Alkaline Phosphatase: 40 IU/L — ABNORMAL LOW (ref 44–121)
BUN/Creatinine Ratio: 13 (ref 9–20)
BUN: 14 mg/dL (ref 6–24)
Bilirubin Total: 0.3 mg/dL (ref 0.0–1.2)
CO2: 24 mmol/L (ref 20–29)
Calcium: 9.4 mg/dL (ref 8.7–10.2)
Chloride: 98 mmol/L (ref 96–106)
Creatinine, Ser: 1.05 mg/dL (ref 0.76–1.27)
Globulin, Total: 2 g/dL (ref 1.5–4.5)
Glucose: 95 mg/dL (ref 70–99)
Potassium: 4.7 mmol/L (ref 3.5–5.2)
Sodium: 137 mmol/L (ref 134–144)
Total Protein: 6.5 g/dL (ref 6.0–8.5)
eGFR: 84 mL/min/{1.73_m2} (ref >59)

## 2022-06-23 DIAGNOSIS — D1721 Benign lipomatous neoplasm of skin and subcutaneous tissue of right arm: Secondary | ICD-10-CM | POA: Diagnosis not present

## 2022-06-26 ENCOUNTER — Encounter: Payer: Self-pay | Admitting: Family Medicine

## 2022-06-26 ENCOUNTER — Other Ambulatory Visit: Payer: Self-pay | Admitting: Family Medicine

## 2022-06-26 ENCOUNTER — Telehealth: Payer: Self-pay

## 2022-06-26 DIAGNOSIS — R2231 Localized swelling, mass and lump, right upper limb: Secondary | ICD-10-CM

## 2022-06-26 NOTE — Telephone Encounter (Signed)
Dillon Lee's daughter called for her Dad.  He is tied up in meetings today but needs to have an MRI of his right shoulder w/w/o contrast to better define the mass.  He is willing to go to [facility] for the MRI.  Dr. Tobie Poet made aware and Luetta Nutting is going to work on scheduling ASAP.

## 2022-07-17 ENCOUNTER — Ambulatory Visit
Admission: RE | Admit: 2022-07-17 | Discharge: 2022-07-17 | Disposition: A | Discharge status: Home / Self Care | Payer: BC Managed Care – PPO | Source: Ambulatory Visit | Attending: Family Medicine | Admitting: Family Medicine

## 2022-07-17 DIAGNOSIS — R2231 Localized swelling, mass and lump, right upper limb: Secondary | ICD-10-CM

## 2022-07-17 DIAGNOSIS — D179 Benign lipomatous neoplasm, unspecified: Secondary | ICD-10-CM | POA: Diagnosis not present

## 2022-07-17 MED ORDER — GADOPICLENOL 0.5 MMOL/ML IV SOLN
7.5000 mL | Freq: Once | INTRAVENOUS | Status: AC | PRN
  Administered 2022-07-17: 7.5 mL via INTRAVENOUS

## 2022-07-21 DIAGNOSIS — D1721 Benign lipomatous neoplasm of skin and subcutaneous tissue of right arm: Secondary | ICD-10-CM | POA: Diagnosis not present

## 2022-08-11 ENCOUNTER — Other Ambulatory Visit: Payer: Self-pay | Admitting: Family Medicine

## 2022-08-11 DIAGNOSIS — D1721 Benign lipomatous neoplasm of skin and subcutaneous tissue of right arm: Secondary | ICD-10-CM | POA: Diagnosis not present

## 2022-08-11 DIAGNOSIS — Z0000 Encounter for general adult medical examination without abnormal findings: Secondary | ICD-10-CM

## 2022-08-19 ENCOUNTER — Encounter: Payer: Self-pay | Admitting: Gastroenterology

## 2022-08-19 ENCOUNTER — Other Ambulatory Visit: Payer: Self-pay | Admitting: Family Medicine

## 2022-08-19 DIAGNOSIS — Z0000 Encounter for general adult medical examination without abnormal findings: Secondary | ICD-10-CM

## 2022-09-21 ENCOUNTER — Other Ambulatory Visit: Payer: Self-pay | Admitting: Family Medicine

## 2022-09-21 DIAGNOSIS — Z0000 Encounter for general adult medical examination without abnormal findings: Secondary | ICD-10-CM

## 2022-10-28 ENCOUNTER — Other Ambulatory Visit: Payer: Self-pay | Admitting: Family Medicine

## 2022-10-28 DIAGNOSIS — Z0000 Encounter for general adult medical examination without abnormal findings: Secondary | ICD-10-CM

## 2022-11-23 ENCOUNTER — Other Ambulatory Visit: Payer: Self-pay | Admitting: Family Medicine

## 2022-11-23 DIAGNOSIS — Z0000 Encounter for general adult medical examination without abnormal findings: Secondary | ICD-10-CM

## 2022-12-24 ENCOUNTER — Other Ambulatory Visit: Payer: Self-pay | Admitting: Family Medicine

## 2022-12-28 DIAGNOSIS — H35373 Puckering of macula, bilateral: Secondary | ICD-10-CM | POA: Diagnosis not present

## 2023-02-03 ENCOUNTER — Other Ambulatory Visit: Payer: Self-pay | Admitting: Family Medicine

## 2023-02-03 DIAGNOSIS — Z0000 Encounter for general adult medical examination without abnormal findings: Secondary | ICD-10-CM

## 2023-03-05 ENCOUNTER — Encounter: Payer: Self-pay | Admitting: Family Medicine

## 2023-03-15 ENCOUNTER — Other Ambulatory Visit: Payer: Self-pay | Admitting: Family Medicine

## 2023-03-15 DIAGNOSIS — Z0000 Encounter for general adult medical examination without abnormal findings: Secondary | ICD-10-CM

## 2023-03-17 DIAGNOSIS — D1801 Hemangioma of skin and subcutaneous tissue: Secondary | ICD-10-CM | POA: Diagnosis not present

## 2023-03-17 DIAGNOSIS — L570 Actinic keratosis: Secondary | ICD-10-CM | POA: Diagnosis not present

## 2023-03-17 DIAGNOSIS — L718 Other rosacea: Secondary | ICD-10-CM | POA: Diagnosis not present

## 2023-04-20 ENCOUNTER — Ambulatory Visit (INDEPENDENT_AMBULATORY_CARE_PROVIDER_SITE_OTHER): Payer: BC Managed Care – PPO | Admitting: Gastroenterology

## 2023-04-20 ENCOUNTER — Encounter: Payer: Self-pay | Admitting: Gastroenterology

## 2023-04-20 VITALS — BP 106/80 | HR 52 | Ht 70.0 in | Wt 180.2 lb

## 2023-04-20 DIAGNOSIS — Z860100 Personal history of colon polyps, unspecified: Secondary | ICD-10-CM

## 2023-04-20 MED ORDER — NA SULFATE-K SULFATE-MG SULF 17.5-3.13-1.6 GM/177ML PO SOLN: 1.0000 | Freq: Once | ORAL | 0 refills | Status: AC

## 2023-04-20 NOTE — Progress Notes (Deleted)
[facility] Consult Note:  History: Dillon Lee 04/20/2023  Referring provider: Blane Ohara, MD  Reason for consult/chief complaint: No chief complaint on file.   Subjective  Prior history:  Screening colonoscopy with Dr. Myrtie Neither April 2019.  Diminutive polyp removed from the appendiceal orifice, and another from the hepatic flexure.  One of them was inflammatory, the other adenomatous.  5-year recall recommended.   Discussed the use of AI scribe software for clinical note transcription with the patient, who gave verbal consent to proceed.  History of Present Illness            ***   ROS:  Review of Systems   Past Medical History: Past Medical History:  Diagnosis Date   Allergy    Bradycardia, unspecified    Gout    Heart murmur    Hyperlipidemia    Hypertension    Other male erectile dysfunction    Raynaud's disease without gangrene      Past Surgical History: Past Surgical History:  Procedure Laterality Date   COARCTATION OF AORTA REPAIR     HERNIA REPAIR       Family History: Family History  Problem Relation Age of Onset   Pancreatic cancer Mother    Skin cancer Paternal Grandfather    Colon cancer Neg Hx    Esophageal cancer Neg Hx    Liver cancer Neg Hx    Rectal cancer Neg Hx    Stomach cancer Neg Hx    Prostate cancer Neg Hx     Social History: Social History   Socioeconomic History   Marital status: Married    Spouse name: Not on file   Number of children: Not on file   Years of education: Not on file   Highest education level: Not on file  Occupational History    Employer: [employer]  Tobacco Use   Smoking status: Never    Passive exposure: Never   Smokeless tobacco: Former    Types: Snuff   Tobacco comments:    occasionally  Vaping Use   Vaping status: Never Used  Substance and Sexual Activity   Alcohol use: Yes    Comment: weekly wine or beer   Drug use: No   Sexual activity: Yes     Partners: Female  Other Topics Concern   Not on file  Social History Narrative   Not on file   Social Determinants of Health   Financial Resource Strain: Low Risk  (05/19/2022)   Overall Financial Resource Strain (CARDIA)    Difficulty of Paying Living Expenses: Not hard at all  Food Insecurity: No Food Insecurity (05/19/2022)   Hunger Vital Sign    Worried About Running Out of Food in the Last Year: Never true    Ran Out of Food in the Last Year: Never true  Transportation Needs: No Transportation Needs (05/13/2021)   PRAPARE - Administrator, Civil Service (Medical): No    Lack of Transportation (Non-Medical): No  Physical Activity: Sufficiently Active (05/19/2022)   Exercise Vital Sign    Days of Exercise per Week: 5 days    Minutes of Exercise per Session: 50 min  Stress: No Stress Concern Present (05/19/2022)   [employer] of Occupational Health - Occupational Stress Questionnaire    Feeling of Stress : Only a little  Social Connections: Socially Integrated (05/19/2022)   Social Connection and Isolation Panel [NHANES]    Frequency of Communication with Friends and Family: More than three times  a week    Frequency of Social Gatherings with Friends and Family: Once a week    Attends Religious Services: More than 4 times per year    Active Member of [employer] or Organizations: Yes    Attends Engineer, structural: More than 4 times per year    Marital Status: Married    Allergies: Allergies  Allergen Reactions   Amlodipine Swelling   Hydralazine Nausea Only   Avelox [Moxifloxacin]    Chlorthalidone     Hyponatremia, confusion.   Eplerenone     hyperkalemia    Outpatient Meds: Current Outpatient Medications  Medication Sig Dispense Refill   allopurinol (ZYLOPRIM) 100 MG tablet TAKE 1 TABLET BY MOUTH DAILY. 90 tablet 3   amLODipine (NORVASC) 5 MG tablet Take 1 tablet by mouth daily.     benazepril (LOTENSIN) 40 MG tablet Take by mouth.      carvedilol (COREG) 6.25 MG tablet Take 1 tablet (6.25 mg total) by mouth 2 (two) times daily. 180 tablet 0   diazepam (VALIUM) 5 MG tablet TAKE 1 TO 2 TABLETS BY MOUTH EVERY DAY AS NEEDED FOR AIRLINE FLIGHTS 30 tablet 1   eplerenone (INSPRA) 25 MG tablet Take 1 tablet by mouth daily.     Omega-3 Fatty Acids (FISH OIL) 1000 MG CAPS Take by mouth.     rosuvastatin (CRESTOR) 40 MG tablet TAKE 1 TABLET BY MOUTH DAILY. 90 tablet 0   tadalafil (CIALIS) 5 MG tablet TAKE 1 TABLET ONCE A DAY 90 tablet 2   traZODone (DESYREL) 50 MG tablet TAKE 1-2 TABLETS AT BEDTIME. 180 tablet 2   No current facility-administered medications for this visit.      ___________________________________________________________________ Objective   Exam:  There were no vitals taken for this visit. Wt Readings from Last 3 Encounters:  05/19/22 183 lb 6.4 oz (83.2 kg)  06/16/21 187 lb (84.8 kg)  05/13/21 181 lb (82.1 kg)    General: ***  Eyes: sclera anicteric, no redness ENT: oral mucosa moist without lesions, no cervical or supraclavicular lymphadenopathy CV: ***, no JVD, no peripheral edema Resp: clear to auscultation bilaterally, normal RR and effort noted GI: soft, *** tenderness, with active bowel sounds. No guarding or palpable organomegaly noted. Skin; warm and dry, no rash or jaundice noted Neuro: awake, alert and oriented x 3. Normal gross motor function and fluent speech   Labs:  ***  Radiologic Studies:  ***   No diagnosis found.  Assessment and Plan              Plan:  ***  Thank you for the courtesy of this consult.  Please call me with any questions or concerns.  Charlie Pitter III  CC: Referring provider noted above

## 2023-04-20 NOTE — Progress Notes (Signed)
This patient was mistakenly booked for an appointment to see me today.  He is due for surveillance colonoscopy with adenomatous polyp discovered in April 2019.  He has a history of congenital aortic coarctation with prior surgery, and he has been stable on follow-up for years with [facility] cardiology.  He last saw them in November 2023 and is due to see them again soon for a follow-up visit and imaging.  He has well-controlled blood pressure and chronic stable bradycardia.  He has no cardiac, pulmonary or digestive symptoms.  Our staff booked him for his colonoscopy, and it was a no charge visit today.

## 2023-04-20 NOTE — Patient Instructions (Signed)
   You have been scheduled for a colonoscopy. Please follow written instructions given to you at your visit today.   Please pick up your prep supplies at the pharmacy within the next 1-3 days.  If you use inhalers (even only as needed), please bring them with you on the day of your procedure.  DO NOT TAKE 7 DAYS PRIOR TO TEST- Trulicity (dulaglutide) Ozempic, Wegovy (semaglutide) Mounjaro (tirzepatide) Bydureon Bcise (exanatide extended release)  DO NOT TAKE 1 DAY PRIOR TO YOUR TEST Rybelsus (semaglutide) Adlyxin (lixisenatide) Victoza (liraglutide) Byetta (exanatide) ___________________________________________________________________________  _______________________________________________________  If your blood pressure at your visit was 140/90 or greater, please contact your primary care physician to follow up on this.  _______________________________________________________  If you are age 65 or older, your body mass index should be between 23-30. Your Body mass index is 25.86 kg/m. If this is out of the aforementioned range listed, please consider follow up with your Primary Care Provider.  If you are age 62 or younger, your body mass index should be between 19-25. Your Body mass index is 25.86 kg/m. If this is out of the aformentioned range listed, please consider follow up with your Primary Care Provider.   ________________________________________________________  The [facility] providers would like to encourage you to use [facility] to communicate with providers for non-urgent requests or questions.  Due to long hold times on the telephone, sending your provider a message by [facility] may be a faster and more efficient way to get a response.  Please allow 48 business hours for a response.  Please remember that this is for non-urgent requests.  _______________________________________________________ It was a pleasure to see you today!  Thank you for trusting me with your  gastrointestinal care!

## 2023-04-29 ENCOUNTER — Other Ambulatory Visit: Payer: Self-pay | Admitting: Family Medicine

## 2023-04-29 DIAGNOSIS — Z0000 Encounter for general adult medical examination without abnormal findings: Secondary | ICD-10-CM

## 2023-05-20 DIAGNOSIS — R001 Bradycardia, unspecified: Secondary | ICD-10-CM | POA: Diagnosis not present

## 2023-05-20 DIAGNOSIS — Q251 Coarctation of aorta: Secondary | ICD-10-CM | POA: Diagnosis not present

## 2023-05-20 DIAGNOSIS — I158 Other secondary hypertension: Secondary | ICD-10-CM | POA: Diagnosis not present

## 2023-05-20 DIAGNOSIS — I10 Essential (primary) hypertension: Secondary | ICD-10-CM | POA: Diagnosis not present

## 2023-05-25 NOTE — Progress Notes (Unsigned)
Subjective:  Patient ID: Dillon Lee, male    DOB: 31-Jan-1966  Age: 57 y.o. MRN: [id number]  No chief complaint on file.   HPI  Well Adult Physical: Patient here for a comprehensive physical exam.The patient reports {problems:16946} Do you take any herbs or supplements that were not prescribed by a doctor? {yes/no/not asked:9010} Are you taking calcium supplements? {yes/no:63} Are you taking aspirin daily? {yes/no:63}  Encounter for general adult medical examination without abnormal findings  Physical ("At Risk" items are starred): Patient's last physical exam was 1 year ago .  Patient wears a seat belt, has smoke detectors, has carbon monoxide detectors, practices appropriate gun safety, and wears sunscreen with extended sun exposure. Dental Care: biannual cleanings, brushes and flosses daily. Ophthalmology/Optometry: Annual visit.  Hearing loss: none Vision impairments: none Last PSA:     05/19/2022    9:09 AM 05/13/2021    8:17 AM 04/30/2020    9:24 PM  Depression screen PHQ 2/9  Decreased Interest 0 0 0  Down, Depressed, Hopeless 0 0 0  PHQ - 2 Score 0 0 0  Altered sleeping 2 0   Tired, decreased energy 0 0   Change in appetite 1 0   Feeling bad or failure about yourself  0 0   Trouble concentrating 0 0   Moving slowly or fidgety/restless 0 0   Suicidal thoughts 0 0   PHQ-9 Score 3 0   Difficult doing work/chores Not difficult at all Not difficult at all          05/13/2021    8:16 AM  Fall Risk  Falls in the past year? 0  Was there an injury with Fall? 0  Fall Risk Category Calculator 0  Fall Risk Category (Retired) Low  (RETIRED) Patient Fall Risk Level Low fall risk  Patient at Risk for Falls Due to No Fall Risks  Fall risk Follow up Falls evaluation completed              Past Medical History:  Diagnosis Date   Adenomatous colon polyp    Allergy    Bradycardia, unspecified    Gout    Heart murmur    Hyperlipidemia    Hypertension    Other  male erectile dysfunction    Raynaud's disease without gangrene    Past Surgical History:  Procedure Laterality Date   COARCTATION OF AORTA REPAIR     HERNIA REPAIR      Family History  Problem Relation Age of Onset   Pancreatic cancer Mother    Skin cancer Paternal Grandfather    Colon cancer Neg Hx    Esophageal cancer Neg Hx    Liver cancer Neg Hx    Rectal cancer Neg Hx    Stomach cancer Neg Hx    Prostate cancer Neg Hx    Social History   Socioeconomic History   Marital status: Married    Spouse name: Not on file   Number of children: Not on file   Years of education: Not on file   Highest education level: Master's degree (e.g., MA, MS, MEng, MEd, MSW, MBA)  Occupational History    Employer: [employer]  Tobacco Use   Smoking status: Never    Passive exposure: Never   Smokeless tobacco: Former    Types: Snuff   Tobacco comments:    occasionally  Vaping Use   Vaping status: Never Used  Substance and Sexual Activity   Alcohol use: Yes    Comment: weekly  wine or beer   Drug use: No   Sexual activity: Yes    Partners: Female  Other Topics Concern   Not on file  Social History Narrative   Not on file   Social Drivers of Health   Financial Resource Strain: Low Risk  (05/25/2023)   Overall Financial Resource Strain (CARDIA)    Difficulty of Paying Living Expenses: Not hard at all  Food Insecurity: No Food Insecurity (05/25/2023)   Hunger Vital Sign    Worried About Running Out of Food in the Last Year: Never true    Ran Out of Food in the Last Year: Never true  Transportation Needs: No Transportation Needs (05/25/2023)   PRAPARE - Administrator, Civil Service (Medical): No    Lack of Transportation (Non-Medical): No  Physical Activity: Sufficiently Active (05/25/2023)   Exercise Vital Sign    Days of Exercise per Week: 5 days    Minutes of Exercise per Session: 40 min  Stress: Stress Concern Present (05/25/2023)   [employer] of  Occupational Health - Occupational Stress Questionnaire    Feeling of Stress : Rather much  Social Connections: Moderately Isolated (05/25/2023)   Social Connection and Isolation Panel [NHANES]    Frequency of Communication with Friends and Family: Once a week    Frequency of Social Gatherings with Friends and Family: Once a week    Attends Religious Services: 1 to 4 times per year    Active Member of [employer] or Organizations: No    Attends Engineer, structural: Not on file    Marital Status: Married   Review of Systems   Objective:  There were no vitals taken for this visit.     04/20/2023    3:53 PM 04/20/2023    3:45 PM 05/19/2022    8:57 AM  BP/Weight  Systolic BP 106 162 128  Diastolic BP 80 76 68  Wt. (Lbs)  180.25 183.4  BMI  25.86 kg/m2 25.58 kg/m2    Physical Exam  Lab Results  Component Value Date   WBC 8.4 05/19/2022   HGB 13.7 05/19/2022   HCT 40.0 05/19/2022   PLT 212 05/19/2022   GLUCOSE 95 06/16/2022   CHOL 177 05/19/2022   TRIG 80 05/19/2022   HDL 82 05/19/2022   LDLCALC 80 05/19/2022   ALT 39 06/16/2022   AST 33 06/16/2022   NA 137 06/16/2022   K 4.7 06/16/2022   CL 98 06/16/2022   CREATININE 1.05 06/16/2022   BUN 14 06/16/2022   CO2 24 06/16/2022   TSH 0.978 05/19/2022   HGBA1C 5.6 05/19/2022      Assessment & Plan:  There are no diagnoses linked to this encounter.   There is no height or weight on file to calculate BMI.   These are the goals we discussed:  Goals   None      This is a list of the screening recommended for you and due dates:  Health Maintenance  Topic Date Due   Colon Cancer Screening  09/09/2022   Flu Shot  01/07/2023   COVID-19 Vaccine (4 - 2024-25 season) 02/07/2023   DTaP/Tdap/Td vaccine (3 - Td or Tdap) 07/15/2031   Zoster (Shingles) Vaccine  Completed   HPV Vaccine  Aged Out   Hepatitis C Screening  Discontinued   HIV Screening  Discontinued     No orders of the defined types were placed in  this encounter.    Follow-up: No follow-ups on file.  An After Visit Summary was printed and given to the patient.  Langley Gauss, [facility] [phone]

## 2023-05-26 ENCOUNTER — Ambulatory Visit (INDEPENDENT_AMBULATORY_CARE_PROVIDER_SITE_OTHER): Payer: BC Managed Care – PPO | Admitting: Physician Assistant

## 2023-05-26 ENCOUNTER — Encounter: Payer: Self-pay | Admitting: Physician Assistant

## 2023-05-26 VITALS — BP 138/70 | HR 40 | Temp 97.6°F | Resp 16 | Ht 70.0 in | Wt 181.8 lb

## 2023-05-26 DIAGNOSIS — E875 Hyperkalemia: Secondary | ICD-10-CM | POA: Diagnosis not present

## 2023-05-26 DIAGNOSIS — Z23 Encounter for immunization: Secondary | ICD-10-CM | POA: Diagnosis not present

## 2023-05-26 DIAGNOSIS — E782 Mixed hyperlipidemia: Secondary | ICD-10-CM

## 2023-05-26 DIAGNOSIS — Z0000 Encounter for general adult medical examination without abnormal findings: Secondary | ICD-10-CM | POA: Diagnosis not present

## 2023-05-26 DIAGNOSIS — Z125 Encounter for screening for malignant neoplasm of prostate: Secondary | ICD-10-CM | POA: Diagnosis not present

## 2023-05-26 DIAGNOSIS — I10 Essential (primary) hypertension: Secondary | ICD-10-CM

## 2023-05-26 DIAGNOSIS — R7303 Prediabetes: Secondary | ICD-10-CM

## 2023-05-26 DIAGNOSIS — N521 Erectile dysfunction due to diseases classified elsewhere: Secondary | ICD-10-CM

## 2023-05-26 MED ORDER — TRAZODONE HCL 50 MG PO TABS: ORAL_TABLET | ORAL | 2 refills | Status: AC

## 2023-05-26 MED ORDER — TADALAFIL 5 MG PO TABS: 5.0000 mg | ORAL_TABLET | Freq: Every day | ORAL | 2 refills | Status: DC

## 2023-05-26 MED ORDER — ROSUVASTATIN CALCIUM 40 MG PO TABS: 40.0000 mg | ORAL_TABLET | Freq: Every day | ORAL | 0 refills | Status: DC

## 2023-05-27 LAB — COMPREHENSIVE METABOLIC PANEL
ALT: 25 [IU]/L (ref 0–44)
AST: 29 [IU]/L (ref 0–40)
Albumin: 4.6 g/dL (ref 3.8–4.9)
Alkaline Phosphatase: 39 [IU]/L — ABNORMAL LOW (ref 44–121)
BUN/Creatinine Ratio: 16 (ref 9–20)
BUN: 16 mg/dL (ref 6–24)
Bilirubin Total: 0.5 mg/dL (ref 0.0–1.2)
CO2: 23 mmol/L (ref 20–29)
Calcium: 9.9 mg/dL (ref 8.7–10.2)
Chloride: 97 mmol/L (ref 96–106)
Creatinine, Ser: 1.03 mg/dL (ref 0.76–1.27)
Globulin, Total: 2.1 g/dL (ref 1.5–4.5)
Glucose: 93 mg/dL (ref 70–99)
Potassium: 5.6 mmol/L — ABNORMAL HIGH (ref 3.5–5.2)
Sodium: 137 mmol/L (ref 134–144)
Total Protein: 6.7 g/dL (ref 6.0–8.5)
eGFR: 85 mL/min/{1.73_m2} (ref >59)

## 2023-05-27 LAB — LIPID PANEL
Chol/HDL Ratio: 2.3 {ratio} (ref 0.0–5.0)
Cholesterol, Total: 187 mg/dL (ref 100–199)
HDL: 80 mg/dL (ref >39)
LDL Chol Calc (NIH): 92 mg/dL (ref 0–99)
Triglycerides: 84 mg/dL (ref 0–149)
VLDL Cholesterol Cal: 15 mg/dL (ref 5–40)

## 2023-05-27 LAB — CBC WITH DIFFERENTIAL/PLATELET
Basophils Absolute: 0 10*3/uL (ref 0.0–0.2)
Basos: 0 %
EOS (ABSOLUTE): 0.1 10*3/uL (ref 0.0–0.4)
Eos: 2 %
Hematocrit: 39.8 % (ref 37.5–51.0)
Hemoglobin: 13.5 g/dL (ref 13.0–17.7)
Immature Grans (Abs): 0 10*3/uL (ref 0.0–0.1)
Immature Granulocytes: 0 %
Lymphocytes Absolute: 2.3 10*3/uL (ref 0.7–3.1)
Lymphs: 32 %
MCH: 30.6 pg (ref 26.6–33.0)
MCHC: 33.9 g/dL (ref 31.5–35.7)
MCV: 90 fL (ref 79–97)
Monocytes Absolute: 0.6 10*3/uL (ref 0.1–0.9)
Monocytes: 8 %
Neutrophils Absolute: 4 10*3/uL (ref 1.4–7.0)
Neutrophils: 58 %
Platelets: 241 10*3/uL (ref 150–450)
RBC: 4.41 x10E6/uL (ref 4.14–5.80)
RDW: 12.5 % (ref 11.6–15.4)
WBC: 6.9 10*3/uL (ref 3.4–10.8)

## 2023-05-27 LAB — HEMOGLOBIN A1C
Est. average glucose Bld gHb Est-mCnc: 117 mg/dL
Hgb A1c MFr Bld: 5.7 % — ABNORMAL HIGH (ref 4.8–5.6)

## 2023-05-27 LAB — TSH: TSH: 1.39 u[IU]/mL (ref 0.450–4.500)

## 2023-05-27 LAB — T4, FREE: Free T4: 1.13 ng/dL (ref 0.82–1.77)

## 2023-05-27 LAB — PSA: Prostate Specific Ag, Serum: 1.5 ng/mL (ref 0.0–4.0)

## 2023-06-03 ENCOUNTER — Ambulatory Visit: Payer: Self-pay

## 2023-06-03 ENCOUNTER — Other Ambulatory Visit: Payer: Self-pay

## 2023-06-03 DIAGNOSIS — E875 Hyperkalemia: Secondary | ICD-10-CM

## 2023-06-03 NOTE — Telephone Encounter (Signed)
Patient advised he has read his results on MyChart and does not need to discuss anything he knows what he needs to do. He states that he does not need a phone call back. Thank you   Jule Economy T

## 2023-06-15 ENCOUNTER — Encounter: Payer: Self-pay | Admitting: Gastroenterology

## 2023-06-18 ENCOUNTER — Ambulatory Visit: Payer: BC Managed Care – PPO

## 2023-06-18 DIAGNOSIS — E875 Hyperkalemia: Secondary | ICD-10-CM | POA: Diagnosis not present

## 2023-06-19 LAB — COMPREHENSIVE METABOLIC PANEL
ALT: 46 [IU]/L — ABNORMAL HIGH (ref 0–44)
AST: 32 [IU]/L (ref 0–40)
Albumin: 4.5 g/dL (ref 3.8–4.9)
Alkaline Phosphatase: 39 [IU]/L — ABNORMAL LOW (ref 44–121)
BUN/Creatinine Ratio: 10 (ref 9–20)
BUN: 11 mg/dL (ref 6–24)
Bilirubin Total: 0.5 mg/dL (ref 0.0–1.2)
CO2: 24 mmol/L (ref 20–29)
Calcium: 9.4 mg/dL (ref 8.7–10.2)
Chloride: 98 mmol/L (ref 96–106)
Creatinine, Ser: 1.14 mg/dL (ref 0.76–1.27)
Globulin, Total: 2.1 g/dL (ref 1.5–4.5)
Glucose: 97 mg/dL (ref 70–99)
Potassium: 5.2 mmol/L (ref 3.5–5.2)
Sodium: 137 mmol/L (ref 134–144)
Total Protein: 6.6 g/dL (ref 6.0–8.5)
eGFR: 75 mL/min/{1.73_m2} (ref >59)

## 2023-06-23 ENCOUNTER — Ambulatory Visit: Payer: BC Managed Care – PPO | Admitting: Gastroenterology

## 2023-06-23 ENCOUNTER — Encounter: Payer: Self-pay | Admitting: Gastroenterology

## 2023-06-23 VITALS — BP 117/80 | HR 42 | Temp 97.5°F | Resp 14 | Ht 70.0 in | Wt 180.4 lb

## 2023-06-23 DIAGNOSIS — Z1211 Encounter for screening for malignant neoplasm of colon: Secondary | ICD-10-CM

## 2023-06-23 DIAGNOSIS — D123 Benign neoplasm of transverse colon: Secondary | ICD-10-CM | POA: Diagnosis not present

## 2023-06-23 DIAGNOSIS — Q438 Other specified congenital malformations of intestine: Secondary | ICD-10-CM

## 2023-06-23 DIAGNOSIS — Z860100 Personal history of colon polyps, unspecified: Secondary | ICD-10-CM

## 2023-06-23 DIAGNOSIS — K648 Other hemorrhoids: Secondary | ICD-10-CM | POA: Diagnosis not present

## 2023-06-23 MED ORDER — SODIUM CHLORIDE 0.9 % IV SOLN: 500.0000 mL | Freq: Once | INTRAVENOUS | Status: DC

## 2023-06-23 NOTE — Patient Instructions (Signed)

## 2023-06-23 NOTE — Op Note (Signed)
 [facility] Patient Name: Dillon Lee Procedure Date: 06/23/2023 8:37 AM MRN: [id number] Endoscopist: Ace Abu L. Dominic Friendly , MD, [id number] Age: 58 Referring MD:  Date of Birth: 1965-06-09 Gender: Male Account #: [id number] Procedure:                Colonoscopy Indications:              Surveillance: Personal history of adenomatous                            polyps on last colonoscopy > 5 years ago                           [facility] TA April 2019 Medicines:                Monitored Anesthesia Care Procedure:                Pre-Anesthesia Assessment:                           - Prior to the procedure, a History and Physical                            was performed, and patient medications and                            allergies were reviewed. The patient's tolerance of                            previous anesthesia was also reviewed. The risks                            and benefits of the procedure and the sedation                            options and risks were discussed with the patient.                            All questions were answered, and informed consent                            was obtained. Prior Anticoagulants: The patient has                            taken no anticoagulant or antiplatelet agents. ASA                            Grade Assessment: II - A patient with mild systemic                            disease. After reviewing the risks and benefits,                            the patient was deemed in satisfactory condition to  undergo the procedure.                           After obtaining informed consent, the colonoscope                            was passed under direct vision. Throughout the                            procedure, the patient's blood pressure, pulse, and                            oxygen saturations were monitored continuously. The                            Olympus Scope SN [id number] was introduced through the                             anus and advanced to the the cecum, identified by                            appendiceal orifice and ileocecal valve. The                            colonoscopy was somewhat difficult due to a                            redundant colon. Successful completion of the                            procedure was aided by using manual pressure and                            straightening and shortening the scope to obtain                            bowel loop reduction. The patient tolerated the                            procedure well. The quality of the bowel                            preparation was good. The ileocecal valve,                            appendiceal orifice, and rectum were photographed. Scope In: 8:43:08 AM Scope Out: 8:57:12 AM Scope Withdrawal Time: 0 hours 9 minutes 10 seconds  Total Procedure Duration: 0 hours 14 minutes 4 seconds  Findings:                 The perianal and digital rectal examinations were                            normal.  Repeat examination of right colon under NBI                            performed.                           A diminutive polyp was found in the distal                            transverse colon. The polyp was sessile. The polyp                            was removed with a cold snare. Resection and                            retrieval were complete.                           Internal hemorrhoids were found. The hemorrhoids                            were small.                           The exam was otherwise without abnormality on                            direct and retroflexion views. Complications:            No immediate complications. Estimated Blood Loss:     Estimated blood loss was minimal. Impression:               - One diminutive polyp in the distal transverse                            colon, removed with a cold snare. Resected and                             retrieved.                           - Internal hemorrhoids.                           - The examination was otherwise normal on direct                            and retroflexion views. Recommendation:           - Patient has a contact number available for                            emergencies. The signs and symptoms of potential                            delayed complications were discussed with the  patient. Return to normal activities tomorrow.                            Written discharge instructions were provided to the                            patient.                           - Resume previous diet.                           - Continue present medications.                           - Await pathology results.                           - Repeat colonoscopy is recommended for                            surveillance. The colonoscopy date will be                            determined after pathology results from today's                            exam become available for review. Doryce Mcgregory L. Dominic Friendly, MD 06/23/2023 9:00:38 AM This report has been signed electronically.

## 2023-06-23 NOTE — Progress Notes (Signed)
 History and Physical:  This patient presents for endoscopic testing for: Encounter Diagnosis  Name Primary?   Hx of colonic polyps Yes   Surveillance colonoscopy today [facility] TA and an inflammatory polyp April 2019  Patient is otherwise without complaints or active issues today.   Past Medical History: Past Medical History:  Diagnosis Date   Adenomatous colon polyp    Allergy    Bradycardia, unspecified    Gout    Heart murmur    Hyperlipidemia    Hypertension    Other male erectile dysfunction    Raynaud's disease without gangrene      Past Surgical History: Past Surgical History:  Procedure Laterality Date   COARCTATION OF AORTA REPAIR     HERNIA REPAIR      Allergies: Allergies  Allergen Reactions   Chlorthalidone Other (See Comments)    Hyponatremia, confusion.   Amlodipine Swelling   Hydralazine Nausea Only   Avelox [Moxifloxacin] Other (See Comments)   Eplerenone     hyperkalemia    Outpatient Meds: Current Outpatient Medications  Medication Sig Dispense Refill   allopurinol  (ZYLOPRIM ) 100 MG tablet TAKE 1 TABLET BY MOUTH DAILY. 90 tablet 3   amLODipine (NORVASC) 5 MG tablet Take 1 tablet by mouth daily.     benazepril  (LOTENSIN ) 40 MG tablet Take by mouth.     carvedilol  (COREG ) 6.25 MG tablet Take 1 tablet (6.25 mg total) by mouth 2 (two) times daily. 180 tablet 0   diazepam  (VALIUM ) 5 MG tablet TAKE 1 TO 2 TABLETS BY MOUTH EVERY DAY AS NEEDED FOR AIRLINE FLIGHTS 30 tablet 1   eplerenone (INSPRA) 25 MG tablet Take 25 mg by mouth daily.     Omega-3 Fatty Acids (FISH OIL) 1000 MG CAPS Take by mouth.     rosuvastatin  (CRESTOR ) 40 MG tablet Take 1 tablet (40 mg total) by mouth daily. 90 tablet 0   traZODone  (DESYREL ) 50 MG tablet TAKE 1-2 TABLETS AT BEDTIME. 180 tablet 2   tadalafil  (CIALIS ) 5 MG tablet Take 1 tablet (5 mg total) by mouth daily. 90 tablet 2   Current Facility-Administered Medications  Medication Dose Route Frequency Provider Last Rate  Last Admin   0.9 %  sodium chloride  infusion  500 mL Intravenous Once Danis, Cordelia Dessert, MD          ___________________________________________________________________ Objective   Exam:  BP (!) 167/99   Pulse (!) 44   Temp (!) 97.5 F (36.4 C) (Skin)   Ht 5\' 10"  (1.778 m)   Wt 180 lb 6.4 oz (81.8 kg)   SpO2 100%   BMI 25.88 kg/m   CV: regular , S1/S2 Resp: clear to auscultation bilaterally, normal RR and effort noted GI: soft, no tenderness, with active bowel sounds.   Assessment: Encounter Diagnosis  Name Primary?   Hx of colonic polyps Yes     Plan: Colonoscopy   The benefits and risks of the planned procedure were described in detail with the patient or (when appropriate) their health care proxy.  Risks were outlined as including, but not limited to, bleeding, infection, perforation, adverse medication reaction leading to cardiac or pulmonary decompensation, pancreatitis (if ERCP).  The limitation of incomplete mucosal visualization was also discussed.  No guarantees or warranties were given.  The patient is appropriate for an endoscopic procedure in the ambulatory setting.   - Lorella Roles, MD

## 2023-06-23 NOTE — Progress Notes (Signed)
 Called to room to assist during endoscopic procedure.  Patient ID and intended procedure confirmed with present staff. Received instructions for my participation in the procedure from the performing physician.

## 2023-06-23 NOTE — Progress Notes (Signed)
 Pt's states no medical or surgical changes since previsit or office visit.

## 2023-06-23 NOTE — Progress Notes (Signed)
 Sedate, gd SR, tolerated procedure well, VSS, report to RN

## 2023-06-24 ENCOUNTER — Telehealth: Payer: Self-pay

## 2023-06-24 DIAGNOSIS — Z1211 Encounter for screening for malignant neoplasm of colon: Secondary | ICD-10-CM | POA: Diagnosis not present

## 2023-06-24 NOTE — Telephone Encounter (Signed)
Attempted f/u call. No answer, left VM. 

## 2023-06-25 LAB — SURGICAL PATHOLOGY

## 2023-06-29 ENCOUNTER — Encounter: Payer: Self-pay | Admitting: Gastroenterology

## 2023-08-22 ENCOUNTER — Ambulatory Visit (HOSPITAL_BASED_OUTPATIENT_CLINIC_OR_DEPARTMENT_OTHER)
Admit: 2023-08-22 | Discharge: 2023-08-22 | Disposition: A | Discharge status: Home / Self Care | Attending: Family Medicine | Admitting: Family Medicine

## 2023-08-22 ENCOUNTER — Ambulatory Visit (HOSPITAL_BASED_OUTPATIENT_CLINIC_OR_DEPARTMENT_OTHER)
Admission: EM | Admit: 2023-08-22 | Discharge: 2023-08-22 | Disposition: A | Discharge status: Home / Self Care | Attending: Family Medicine | Admitting: Family Medicine

## 2023-08-22 ENCOUNTER — Encounter (HOSPITAL_BASED_OUTPATIENT_CLINIC_OR_DEPARTMENT_OTHER): Payer: Self-pay

## 2023-08-22 DIAGNOSIS — S99921A Unspecified injury of right foot, initial encounter: Secondary | ICD-10-CM | POA: Diagnosis not present

## 2023-08-22 DIAGNOSIS — W2203XA Walked into furniture, initial encounter: Secondary | ICD-10-CM

## 2023-08-22 DIAGNOSIS — M79674 Pain in right toe(s): Secondary | ICD-10-CM

## 2023-08-22 NOTE — ED Triage Notes (Addendum)
 States kicked piece of furniture Thursday night. States struck 5th toe. Significant discoloration up onto foot. States becomes very painful when ambulating.

## 2023-08-22 NOTE — ED Provider Notes (Signed)
 [facility]    CSN: [id number] Arrival date & time: 08/22/23  1515      History   Chief Complaint Chief Complaint  Patient presents with   Toe Injury    HPI Dillon Lee is a 58 y.o. male.   Patient is a 58 year old male who presents today with right foot injury.  Reports that he hit his foot on a piece of furniture a few nights ago.  Has had bruising and swelling to the foot and specifically the fifth digit.  Pain with walking but normal range of motion otherwise.  Denies any numbness, tingling     Past Medical History:  Diagnosis Date   Adenomatous colon polyp    Allergy    Bradycardia, unspecified    Gout    Heart murmur    Hyperlipidemia    Hypertension    Other male erectile dysfunction    Raynaud's disease without gangrene     Patient Active Problem List   Diagnosis Date Noted   Encounter for immunization 05/26/2023   Routine medical exam 05/26/2023   Erectile dysfunction due to diseases classified elsewhere 05/26/2023   Encounter for general adult medical examination with abnormal findings 05/24/2022   Prediabetes 05/24/2022   Mixed hyperlipidemia 05/24/2022   Prostate cancer screening 05/24/2022   Mass of right axilla 05/24/2022   Family history of prostate cancer in father 11/25/2021   Benign prostatic hyperplasia with urinary frequency 11/25/2021   Erectile dysfunction due to arterial insufficiency 11/25/2021   Axillary lymphadenopathy 06/16/2021   Hyponatremia 06/16/2021   Hyperkalemia 06/16/2021   Back pain 09/07/2019   Facial laceration 01/19/2019   Coarctation of the aorta, complex 03/06/2011   Hypertension 03/06/2011   Shortened PR interval 03/06/2011    Past Surgical History:  Procedure Laterality Date   COARCTATION OF AORTA REPAIR     HERNIA REPAIR         Home Medications    Prior to Admission medications   Medication Sig Start Date End Date Taking? Authorizing Provider  allopurinol (ZYLOPRIM) 100 MG tablet TAKE 1  TABLET BY MOUTH DAILY. 09/21/22   Cox, Fritzi Mandes, MD  amLODipine (NORVASC) 5 MG tablet Take 1 tablet by mouth daily. 10/15/21   [provider]  benazepril (LOTENSIN) 40 MG tablet Take by mouth. 05/05/22   [provider]  carvedilol (COREG) 6.25 MG tablet Take 1 tablet (6.25 mg total) by mouth 2 (two) times daily. 05/13/21   Janie Morning, NP  diazepam (VALIUM) 5 MG tablet TAKE 1 TO 2 TABLETS BY MOUTH EVERY DAY AS NEEDED FOR AIRLINE FLIGHTS 03/15/23   Cox, Fritzi Mandes, MD  eplerenone (INSPRA) 25 MG tablet Take 25 mg by mouth daily.    [provider]  Omega-3 Fatty Acids (FISH OIL) 1000 MG CAPS Take by mouth.    [provider]  rosuvastatin (CRESTOR) 40 MG tablet Take 1 tablet (40 mg total) by mouth daily. 05/26/23   Langley Gauss, PA  tadalafil (CIALIS) 5 MG tablet Take 1 tablet (5 mg total) by mouth daily. 05/26/23   Langley Gauss, PA  traZODone (DESYREL) 50 MG tablet TAKE 1-2 TABLETS AT BEDTIME. 05/26/23   Langley Gauss, PA    Family History Family History  Problem Relation Age of Onset   Pancreatic cancer Mother    Skin cancer Paternal Grandfather    Colon cancer Neg Hx    Esophageal cancer Neg Hx    Liver cancer Neg Hx    Rectal cancer Neg Hx  Stomach cancer Neg Hx    Prostate cancer Neg Hx     Social History Social History   Tobacco Use   Smoking status: Never    Passive exposure: Never   Smokeless tobacco: Former    Types: Snuff   Tobacco comments:    occasionally  Vaping Use   Vaping status: Never Used  Substance Use Topics   Alcohol use: Yes    Comment: weekly wine or beer   Drug use: No     Allergies   Chlorthalidone, Amlodipine, Hydralazine, Avelox [moxifloxacin], and Eplerenone   Review of Systems Review of Systems  Musculoskeletal:  Positive for arthralgias.   See HPI   Physical Exam Triage Vital Signs ED Triage Vitals [08/22/23 1526]  Encounter Vitals Group     BP 139/77     Systolic BP Percentile      Diastolic  BP Percentile      Pulse Rate (!) 50     Resp 20     Temp (!) 97.3 F (36.3 C)     Temp Source Oral     SpO2 97 %     Weight      Height      Head Circumference      Peak Flow      Pain Score 5     Pain Loc      Pain Education      Exclude from Growth Chart    No data found.  Updated Vital Signs BP 139/77 (BP Location: Right Arm)   Pulse (!) 50   Temp (!) 97.3 F (36.3 C) (Oral)   Resp 20   SpO2 97%   Visual Acuity Right Eye Distance:   Left Eye Distance:   Bilateral Distance:    Right Eye Near:   Left Eye Near:    Bilateral Near:     Physical Exam Musculoskeletal:        General: Swelling, tenderness and signs of injury present.       Legs:     Comments: Bruising and swelling to right foot at the third fourth and fifth metatarsals and specifically more swelling to the fifth digit with discoloration 2+ pulse.   Normal ROM      UC Treatments / Results  Labs (all labs ordered are listed, but only abnormal results are displayed) Labs Reviewed - No data to display  EKG   Radiology No results found.  Procedures Procedures (including critical care time)  Medications Ordered in UC Medications - No data to display  Initial Impression / Assessment and Plan / UC Course  I have reviewed the triage vital signs and the nursing notes.  Pertinent labs & imaging results that were available during my care of the patient were reviewed by me and considered in my medical decision making (see chart for details).     Foot injury-no obvious fracture on first look at x-ray films. Waiting for official read. Will go ahead and place in a postop boot just for comfort and to help heal faster.  Recommend ice, elevate and ibuprofen for pain and inflammation. Also recommended if symptoms continue or worsen will need to see orthopedic. Final Clinical Impressions(s) / UC Diagnoses   Final diagnoses:  Injury of right foot, initial encounter     Discharge Instructions       There are no obvious fractures on your x-ray.  Like I said I have not got the final read from the radiologist you can get this in  your MyChart once those appear We have put you in a postop shoe to help stabilize the foot and heal faster Recommend icing the injury and continue with ibuprofen for pain and inflammation Follow-up with Ortho if needed     ED Prescriptions   None    PDMP not reviewed this encounter.   Janace Aris, FNP 08/22/23 [phone]

## 2023-08-22 NOTE — Discharge Instructions (Addendum)
 There are no obvious fractures on your x-ray.  Like I said I have not got the final read from the radiologist you can get this in your MyChart once those appear We have put you in a postop shoe to help stabilize the foot and heal faster Recommend icing the injury and continue with ibuprofen for pain and inflammation Follow-up with Ortho if needed

## 2023-09-14 ENCOUNTER — Other Ambulatory Visit: Payer: Self-pay | Admitting: Family Medicine

## 2023-09-14 DIAGNOSIS — Z0000 Encounter for general adult medical examination without abnormal findings: Secondary | ICD-10-CM

## 2023-09-20 ENCOUNTER — Other Ambulatory Visit: Payer: Self-pay | Admitting: Family Medicine

## 2023-09-20 DIAGNOSIS — Z0000 Encounter for general adult medical examination without abnormal findings: Secondary | ICD-10-CM

## 2023-10-20 ENCOUNTER — Other Ambulatory Visit: Payer: Self-pay | Admitting: Physician Assistant

## 2023-10-20 DIAGNOSIS — Z0000 Encounter for general adult medical examination without abnormal findings: Secondary | ICD-10-CM

## 2024-01-25 ENCOUNTER — Other Ambulatory Visit: Payer: Self-pay | Admitting: Physician Assistant

## 2024-01-25 DIAGNOSIS — Z0000 Encounter for general adult medical examination without abnormal findings: Secondary | ICD-10-CM

## 2024-03-07 ENCOUNTER — Ambulatory Visit: Payer: Self-pay

## 2024-03-07 NOTE — Telephone Encounter (Signed)
 FYI Only or Action Required?: FYI only for provider.  Patient was last seen in primary care on 05/26/2023 by Milon Cleaves, PA.  Called Nurse Triage reporting Covid Positive.  Symptoms began yesterday.  Interventions attempted: Rest, hydration, or home remedies.  Symptoms are: unchanged.  Triage Disposition: Call PCP Within 24 Hours  Patient/caregiver understands and will follow disposition?: Yes           Copied from CRM #[id number]. Topic: Clinical - Red Word Triage >> Mar 07, 2024  3:41 PM Tiffini S wrote: Red Word that prompted transfer to Nurse Triage: Patient take two COVID test at home- tested positive- current symptoms are sore throat, fever, headache and muscle aches. Reason for Disposition  [1] Patient is NOT HIGH RISK AND [2] strongly requests antiviral medicine AND [3] COVID-19 symptoms present < 5 days    Pt has tested positive for COVID at home d/t son being positive. Pt is experiencing mild symptoms and is unsure if he should be prescribed Paxlovid.  Triager will forward encounter for Dr Sherre 's office to review and advise. Patient verbalized understanding and is expecting call back from office for next steps.   If Rx is sent, please send to [facility] on file, as primary pharmacy does not carry Paxlovid.  Answer Assessment - Initial Assessment Questions 1. SYMPTOMS: What is your main symptom or concern? (e.g., cough, fever, shortness of breath, muscle aches)     Fever, sore throat, nasal drainage, body aches 2. ONSET: When did the symptoms start?      yesterday 3. COUGH: Do you have a cough? If Yes, ask: How bad is the cough?       yes 4. FEVER: Do you have a fever? If Yes, ask: What is your temperature, how was it measured, and when did it start?     100.5 Motrin/tylenol with some relief 5. BREATHING DIFFICULTY: Are you having any difficulty breathing? (e.g., normal; shortness of breath, wheezing, unable to speak)      denies 6.  BETTER-SAME-WORSE: Are you getting better, staying the same or getting worse compared to yesterday?  If getting worse, ask, In what way?     *No Answer* 7. OTHER SYMPTOMS: Do you have any other symptoms?  (e.g., chills, fatigue, headache, loss of smell or taste, muscle pain, sore throat)     fatigue 8. COVID-19 DIAGNOSIS: How do you know that you have COVID? (e.g., positive lab test or self-test, diagnosed by doctor or NP/PA, symptoms after exposure).     + Home test x 2 9. COVID-19 EXPOSURE: Was there any known exposure to COVID before the symptoms began?      Yes, reports son is COVID + 10. COVID-19 VACCINE: Have you had the COVID-19 vaccine? If Yes, ask: When did you last get it?       Has had 4 COVID vaccs 11. HIGH RISK DISEASE: Do you have any chronic medical problems? (e.g., asthma, heart or lung disease, weak immune system, obesity, etc.)       HTN 12. PREGNANCY: Is there any chance you are pregnant? When was your last menstrual period?       N/a 13. O2 SATURATION MONITOR:  Do you use an oxygen saturation monitor (pulse oximeter) at home? If Yes, ask What is your reading (oxygen level) today? What is your usual oxygen saturation reading? (e.g., 95%)       N/a  Protocols used: COVID-19 - Diagnosed or Suspected-A-AH

## 2024-03-09 NOTE — Telephone Encounter (Signed)
 Left patient a voicemail informed him we were calling to see how he was feeling, advised that he should push fluids and rest, unless he has been advised differently he can take tylenol and or ibuprofen for fever reduction. He if experiences shob or worsening symptoms to go to the ED or contact our office.

## 2024-03-13 ENCOUNTER — Ambulatory Visit: Payer: Self-pay

## 2024-03-13 NOTE — Progress Notes (Unsigned)
 Acute Office Visit  Subjective:    Patient ID: Dillon Lee, male    DOB: 1966/02/27, 58 y.o.   MRN: [id number]  No chief complaint on file.   HPI: Patient is in today for ***  Discussed the use of AI scribe software for clinical note transcription with the patient, who gave verbal consent to proceed.  History of Present Illness     Past Medical History:  Diagnosis Date   Adenomatous colon polyp    Allergy    Bradycardia, unspecified    Gout    Heart murmur    Hyperlipidemia    Hypertension    Other male erectile dysfunction    Raynaud's disease without gangrene     Past Surgical History:  Procedure Laterality Date   COARCTATION OF AORTA REPAIR     HERNIA REPAIR      Family History  Problem Relation Age of Onset   Pancreatic cancer Mother    Skin cancer Paternal Grandfather    Colon cancer Neg Hx    Esophageal cancer Neg Hx    Liver cancer Neg Hx    Rectal cancer Neg Hx    Stomach cancer Neg Hx    Prostate cancer Neg Hx     Social History   Socioeconomic History   Marital status: Married    Spouse name: Not on file   Number of children: Not on file   Years of education: Not on file   Highest education level: Master's degree (e.g., MA, MS, MEng, MEd, MSW, MBA)  Occupational History    Employer: [employer]  Tobacco Use   Smoking status: Never    Passive exposure: Never   Smokeless tobacco: Former    Types: Snuff   Tobacco comments:    occasionally  Vaping Use   Vaping status: Never Used  Substance and Sexual Activity   Alcohol use: Yes    Comment: weekly wine or beer   Drug use: No   Sexual activity: Yes    Partners: Female  Other Topics Concern   Not on file  Social History Narrative   Not on file   Social Drivers of Health   Financial Resource Strain: Low Risk  (05/25/2023)   Overall Financial Resource Strain (CARDIA)    Difficulty of Paying Living Expenses: Not hard at all  Food Insecurity: No Food Insecurity (05/25/2023)    Hunger Vital Sign    Worried About Running Out of Food in the Last Year: Never true    Ran Out of Food in the Last Year: Never true  Transportation Needs: No Transportation Needs (05/25/2023)   PRAPARE - Administrator, Civil Service (Medical): No    Lack of Transportation (Non-Medical): No  Physical Activity: Sufficiently Active (05/25/2023)   Exercise Vital Sign    Days of Exercise per Week: 5 days    Minutes of Exercise per Session: 40 min  Stress: Stress Concern Present (05/25/2023)   [employer] of Occupational Health - Occupational Stress Questionnaire    Feeling of Stress : Rather much  Social Connections: Moderately Isolated (05/25/2023)   Social Connection and Isolation Panel    Frequency of Communication with Friends and Family: Once a week    Frequency of Social Gatherings with Friends and Family: Once a week    Attends Religious Services: 1 to 4 times per year    Active Member of [employer] or Organizations: No    Attends Banker Meetings: Not on file  Marital Status: Married  Catering manager Violence: Not At Risk (05/26/2023)   Humiliation, Afraid, Rape, and Kick questionnaire    Fear of Current or Ex-Partner: No    Emotionally Abused: No    Physically Abused: No    Sexually Abused: No    Outpatient Medications Prior to Visit  Medication Sig Dispense Refill   allopurinol  (ZYLOPRIM ) 100 MG tablet TAKE 1 TABLET BY MOUTH DAILY. 90 tablet 2   amLODipine (NORVASC) 5 MG tablet Take 1 tablet by mouth daily.     benazepril  (LOTENSIN ) 40 MG tablet Take by mouth.     carvedilol  (COREG ) 6.25 MG tablet Take 1 tablet (6.25 mg total) by mouth 2 (two) times daily. 180 tablet 0   diazepam  (VALIUM ) 5 MG tablet TAKE 1 TO 2 TABLETS BY MOUTH EVERY DAY AS NEEDED FOR AIRLINE FLIGHTS 30 tablet 2   eplerenone (INSPRA) 25 MG tablet Take 25 mg by mouth daily.     Omega-3 Fatty Acids (FISH OIL) 1000 MG CAPS Take by mouth.     rosuvastatin  (CRESTOR ) 40 MG tablet  TAKE 1 TABLET BY MOUTH DAILY. 90 tablet 0   tadalafil  (CIALIS ) 5 MG tablet Take 1 tablet (5 mg total) by mouth daily. 90 tablet 2   traZODone  (DESYREL ) 50 MG tablet TAKE 1-2 TABLETS AT BEDTIME. 180 tablet 2   No facility-administered medications prior to visit.    Allergies  Allergen Reactions   Chlorthalidone Other (See Comments)    Hyponatremia, confusion.   Amlodipine Swelling   Hydralazine Nausea Only   Avelox [Moxifloxacin] Other (See Comments)   Eplerenone     hyperkalemia    Review of Systems     Objective:        08/22/2023    3:26 PM 06/23/2023    9:24 AM 06/23/2023    9:14 AM  Vitals with BMI  Systolic 139 117 892  Diastolic 77 80 83  Pulse 50 42 41    No data found.   Physical Exam  Health Maintenance Due  Topic Date Due   Hepatitis B Vaccines 19-59 Average Risk (1 of 3 - 19+ 3-dose series) Never done   Pneumococcal Vaccine: 50+ Years (1 of 1 - PCV) Never done   Influenza Vaccine  01/07/2024   COVID-19 Vaccine (4 - 2025-26 season) 02/07/2024       Topic Date Due   Hepatitis B Vaccines 19-59 Average Risk (1 of 3 - 19+ 3-dose series) Never done     Lab Results  Component Value Date   TSH 1.390 05/26/2023   Lab Results  Component Value Date   WBC 6.9 05/26/2023   HGB 13.5 05/26/2023   HCT 39.8 05/26/2023   MCV 90 05/26/2023   PLT 241 05/26/2023   Lab Results  Component Value Date   NA 137 06/18/2023   K 5.2 06/18/2023   CO2 24 06/18/2023   GLUCOSE 97 06/18/2023   BUN 11 06/18/2023   CREATININE 1.14 06/18/2023   BILITOT 0.5 06/18/2023   ALKPHOS 39 (L) 06/18/2023   AST 32 06/18/2023   ALT 46 (H) 06/18/2023   PROT 6.6 06/18/2023   ALBUMIN 4.5 06/18/2023   CALCIUM  9.4 06/18/2023   EGFR 75 06/18/2023   Lab Results  Component Value Date   CHOL 187 05/26/2023   Lab Results  Component Value Date   HDL 80 05/26/2023   Lab Results  Component Value Date   LDLCALC 92 05/26/2023   Lab Results  Component Value Date  TRIG 84  05/26/2023   Lab Results  Component Value Date   CHOLHDL 2.3 05/26/2023   Lab Results  Component Value Date   HGBA1C 5.7 (H) 05/26/2023        Results for orders placed or performed in visit on 06/23/23  Surgical pathology ([facility])   Collection Time: 06/23/23 12:00 AM  Result Value Ref Range   SURGICAL PATHOLOGY      SURGICAL PATHOLOGY [facility] [address] [place], [place] [address] Telephone [phone] or [phone] Fax [phone]  REPORT OF SURGICAL PATHOLOGY   Accession #: [id number] Patient Name: RAYNARD, MAPPS Visit # : [id number]  MRN: [id number] Physician: Legrand Shove DOB/Age [birth year] (Age: 33) Gender: M Collected Date: 06/23/2023 Received Date: 06/24/2023  FINAL DIAGNOSIS       1. Surgical [P], colon, transverse, polyp (1) :       TUBULAR ADENOMA.      NEGATIVE FOR HIGH-GRADE DYSPLASIA.       DATE SIGNED OUT: 06/25/2023 ELECTRONIC SIGNATURE : Pepper Dutton Md, Pathologist, Electronic Signature  MICROSCOPIC DESCRIPTION  CASE COMMENTS STAINS USED IN DIAGNOSIS: H&E    CLINICAL HISTORY  SPECIMEN(S) OBTAINED 1. Surgical [P], Colon, Transverse, Polyp (1)  SPECIMEN COMMENTS: 1. Hx of colonic polyps; benign neoplasm of transverse colon SPECIMEN CLINICAL INFORMATION: 1. R/O adenoma    Gross Description 1.  Received in formalin are tan, soft tissue fragments that are submitted in toto. Number: 1 Size: 0.5 cm, (1B) ( TA )        Report signed out from the following location(s) [place] [address] [place], [place] [address] CLIA #: [id number]  [facility] [address] [place], [place] [address] CLIA #: [id number]      Assessment & Plan:   Assessment & Plan     There is no height or weight on file to calculate BMI.SABRA  Assessment and Plan Assessment & Plan      No orders of the defined types were placed in this encounter.   No  orders of the defined types were placed in this encounter.    Follow-up: No follow-ups on file.  An After Visit Summary was printed and given to the patient.  Abigail Free, MD [facility] [phone]

## 2024-03-13 NOTE — Telephone Encounter (Signed)
 FYI Only or Action Required?: FYI only for provider.  Patient was last seen in primary care on 05/26/2023 by Milon Cleaves, PA.  Called Nurse Triage reporting Covid Positive and Cough.  Symptoms began a week ago.  Interventions attempted: OTC medications: robitussin DM, mucinex.  Symptoms are: unchanged.  Triage Disposition: See Physician Within 24 Hours  Patient/caregiver understands and will follow disposition?: Yes        Copied from CRM #[id number]. Topic: Clinical - Red Word Triage >> Mar 13, 2024 11:22 AM [place] MATSU wrote: Red Word that prompted transfer to Nurse Triage:  covid positive symptoms and still not getting better. Per patient cough is getting worse and unsure what to do. Reason for Disposition  [1] Continuous (nonstop) coughing interferes with work or school AND [2] no improvement using cough treatment per Care Advice  Answer Assessment - Initial Assessment Questions 1. SYMPTOMS: What is your main symptom or concern? (e.g., cough, fever, shortness of breath, muscle aches)     Achy, headache, fatigue, persistent cough 2. ONSET: When did the symptoms start?      Last Tuesday  3. COUGH: Do you have a cough? If Yes, ask: How bad is the cough?       Persistent, dry - waking up in the middle of the night 4. FEVER: Do you have a fever? If Yes, ask: What is your temperature, how was it measured, and when did it start?     denies 5. BREATHING DIFFICULTY: Are you having any difficulty breathing? (e.g., normal; shortness of breath, wheezing, unable to speak)      denies 6. BETTER-SAME-WORSE: Are you getting better, staying the same or getting worse compared to yesterday?  If getting worse, ask, In what way?     Better/same, but cough is worse 7. OTHER SYMPTOMS: Do you have any other symptoms?  (e.g., chills, fatigue, headache, loss of smell or taste, muscle pain, sore throat)     N/a 8. COVID-19 DIAGNOSIS: How do you know that you have COVID? (e.g.,  positive lab test or self-test, diagnosed by doctor or NP/PA, symptoms after exposure).     *No Answer* 9. COVID-19 EXPOSURE: Was there any known exposure to COVID before the symptoms began?      *No Answer* 10. COVID-19 VACCINE: Have you had the COVID-19 vaccine? If Yes, ask: When did you last get it?       *No Answer* 11. HIGH RISK DISEASE: Do you have any chronic medical problems? (e.g., asthma, heart or lung disease, weak immune system, obesity, etc.)       *No Answer* 12. PREGNANCY: Is there any chance you are pregnant? When was your last menstrual period?       *No Answer* 13. O2 SATURATION MONITOR:  Do you use an oxygen saturation monitor (pulse oximeter) at home? If Yes, ask What is your reading (oxygen level) today? What is your usual oxygen saturation reading? (e.g., 95%)       *No Answer*  Protocols used: COVID-19 - Diagnosed or Suspected-A-AH, Cough - Acute Non-Productive-A-AH

## 2024-03-14 ENCOUNTER — Telehealth: Admitting: Family Medicine

## 2024-03-14 DIAGNOSIS — U071 COVID-19: Secondary | ICD-10-CM

## 2024-03-14 DIAGNOSIS — J208 Acute bronchitis due to other specified organisms: Secondary | ICD-10-CM | POA: Insufficient documentation

## 2024-03-14 DIAGNOSIS — J069 Acute upper respiratory infection, unspecified: Secondary | ICD-10-CM

## 2024-03-14 MED ORDER — HYDROCODONE BIT-HOMATROP MBR 5-1.5 MG/5ML PO SOLN: 5.0000 mL | Freq: Four times a day (QID) | ORAL | 0 refills | Status: DC | PRN

## 2024-03-14 NOTE — Patient Instructions (Signed)
 Recommend dayquil and nyquil. Ordered hydromet cough syrup. Recommend hot tea with honey.  Recommend lozenges (lifesavers, jolly ranchers)

## 2024-03-14 NOTE — Assessment & Plan Note (Addendum)
 Overall improving  Cough is the major issue.  Recommend dayquil and nyquil. Ordered hydromet cough syrup.  Orders:   HYDROcodone bit-homatropine (HYDROMET) 5-1.5 MG/5ML syrup; Take 5 mLs by mouth every 6 (six) hours as needed for cough.

## 2024-03-20 DIAGNOSIS — D485 Neoplasm of uncertain behavior of skin: Secondary | ICD-10-CM | POA: Diagnosis not present

## 2024-03-20 DIAGNOSIS — D225 Melanocytic nevi of trunk: Secondary | ICD-10-CM | POA: Diagnosis not present

## 2024-03-20 DIAGNOSIS — L821 Other seborrheic keratosis: Secondary | ICD-10-CM | POA: Diagnosis not present

## 2024-03-20 DIAGNOSIS — L739 Follicular disorder, unspecified: Secondary | ICD-10-CM | POA: Diagnosis not present

## 2024-04-25 ENCOUNTER — Other Ambulatory Visit: Payer: Self-pay | Admitting: Family Medicine

## 2024-04-25 DIAGNOSIS — Z0000 Encounter for general adult medical examination without abnormal findings: Secondary | ICD-10-CM

## 2024-05-26 ENCOUNTER — Encounter: Payer: BC Managed Care – PPO | Admitting: Physician Assistant

## 2024-06-04 DIAGNOSIS — H10029 Other mucopurulent conjunctivitis, unspecified eye: Secondary | ICD-10-CM | POA: Diagnosis not present

## 2024-06-10 ENCOUNTER — Other Ambulatory Visit: Payer: Self-pay | Admitting: Family Medicine

## 2024-06-10 ENCOUNTER — Other Ambulatory Visit: Payer: Self-pay | Admitting: Physician Assistant

## 2024-06-10 DIAGNOSIS — N521 Erectile dysfunction due to diseases classified elsewhere: Secondary | ICD-10-CM

## 2024-06-10 DIAGNOSIS — Z0000 Encounter for general adult medical examination without abnormal findings: Secondary | ICD-10-CM

## 2024-06-20 NOTE — Progress Notes (Unsigned)
 "  Subjective:  Patient ID: Dillon Lee, male    DOB: [birth year]  Age: 59 y.o. MRN: [id number]  No chief complaint on file.  HPI: Discussed the use of AI scribe software for clinical note transcription with the patient, who gave verbal consent to proceed.  History of Present Illness The patient presents for an annual physical examination.  He describes the past year as 'pretty good' with only a few minor illnesses, including COVID in October and the flu. No acute complaints or significant changes in health status over the past year.  He is currently on medications including amlodipine, benazepril , carvedilol , Inspra, and Claritin, and requires refills for all. He also takes over-the-counter fish oil. No issues with current medications are reported.  He has a history of heart surgery and attends a yearly follow-up appointment at [facility], which does not typically involve blood work. No new symptoms such as chest pain or shortness of breath are reported.  No chest pain, shortness of breath, difficulty urinating, or abnormal urine. He is originally from [place]  and has a history of participating in sports, which involved traveling. He does not ski but enjoys tobogganing.   Well Adult Physical: Patient here for a comprehensive physical exam.The patient reports no problems Do you take any herbs or supplements that were not prescribed by a doctor? no Are you taking calcium  supplements? no Are you taking aspirin daily? no  Encounter for general adult medical examination without abnormal findings  Physical (At Risk items are starred): Patient's last physical exam was 1 year ago .  Patient wears a seat belt, has smoke detectors, has carbon monoxide detectors, practices appropriate gun safety, and wears sunscreen with extended sun exposure. Dental Care: biannual cleanings, brushes and flosses daily. Ophthalmology/Optometry: Annual visit.  Hearing loss: none Vision impairments: none Last PSA:      05/26/2023    9:19 AM 05/19/2022    9:09 AM 05/13/2021    8:17 AM 04/30/2020    9:24 PM  Depression screen PHQ 2/9  Decreased Interest 0 0 0 0  Down, Depressed, Hopeless 0 0 0 0  PHQ - 2 Score 0 0 0 0  Altered sleeping 2 2 0   Tired, decreased energy 1 0 0   Change in appetite 0 1 0   Feeling bad or failure about yourself  0 0 0   Trouble concentrating 0 0 0   Moving slowly or fidgety/restless 0 0 0   Suicidal thoughts 0 0 0   PHQ-9 Score 3  3  0    Difficult doing work/chores Not difficult at all Not difficult at all Not difficult at all      Data saved with a previous flowsheet row definition         05/13/2021    8:16 AM  Fall Risk  Falls in the past year? 0  Was there an injury with Fall? 0   Fall Risk Category Calculator 0  Fall Risk Category (Retired) Low   (RETIRED) Patient Fall Risk Level Low fall risk   Patient at Risk for Falls Due to No Fall Risks  Fall risk Follow up Falls evaluation completed      Data saved with a previous flowsheet row definition             Past Medical History:  Diagnosis Date   Adenomatous colon polyp    Allergy    Bradycardia, unspecified    Gout    Heart murmur    Hyperlipidemia  Hypertension    Other male erectile dysfunction    Raynaud's disease without gangrene    Past Surgical History:  Procedure Laterality Date   COARCTATION OF AORTA REPAIR     HERNIA REPAIR      Family History  Problem Relation Age of Onset   Pancreatic cancer Mother    Skin cancer Paternal Grandfather    Colon cancer Neg Hx    Esophageal cancer Neg Hx    Liver cancer Neg Hx    Rectal cancer Neg Hx    Stomach cancer Neg Hx    Prostate cancer Neg Hx    Social History   Socioeconomic History   Marital status: Married    Spouse name: Not on file   Number of children: Not on file   Years of education: Not on file   Highest education level: Master's degree (e.g., MA, MS, MEng, MEd, MSW, MBA)  Occupational History    Employer: [employer]   Tobacco Use   Smoking status: Never    Passive exposure: Never   Smokeless tobacco: Former    Types: Snuff   Tobacco comments:    occasionally  Vaping Use   Vaping status: Never Used  Substance and Sexual Activity   Alcohol use: Yes    Comment: weekly wine or beer   Drug use: No   Sexual activity: Yes    Partners: Female  Other Topics Concern   Not on file  Social History Narrative   Not on file   Social Drivers of Health   Tobacco Use: Medium Risk (03/14/2024)   Patient History    Smoking Tobacco Use: Never    Smokeless Tobacco Use: Former    Passive Exposure: Never  Physicist, Medical Strain: Low Risk (06/20/2024)   Overall Financial Resource Strain (CARDIA)    Difficulty of Paying Living Expenses: Not hard at all  Food Insecurity: No Food Insecurity (06/20/2024)   Epic    Worried About Programme Researcher, Broadcasting/film/video in the Last Year: Never true    Ran Out of Food in the Last Year: Never true  Transportation Needs: No Transportation Needs (06/20/2024)   Epic    Lack of Transportation (Medical): No    Lack of Transportation (Non-Medical): No  Physical Activity: Sufficiently Active (06/20/2024)   Exercise Vital Sign    Days of Exercise per Week: 5 days    Minutes of Exercise per Session: 40 min  Stress: Stress Concern Present (06/20/2024)   [employer] of Occupational Health - Occupational Stress Questionnaire    Feeling of Stress: Very much  Social Connections: Moderately Integrated (06/20/2024)   Social Connection and Isolation Panel    Frequency of Communication with Friends and Family: Once a week    Frequency of Social Gatherings with Friends and Family: Once a week    Attends Religious Services: 1 to 4 times per year    Active Member of Clubs or Organizations: Yes    Attends Banker Meetings: 1 to 4 times per year    Marital Status: Married  Depression (PHQ2-9): Low Risk (05/26/2023)   Depression (PHQ2-9)    PHQ-2 Score: 3  Alcohol Screen: Low Risk  (06/20/2024)   Alcohol Screen    Last Alcohol Screening Score (AUDIT): 4  Housing: Low Risk (06/20/2024)   Epic    Unable to Pay for Housing in the Last Year: No    Number of Times Moved in the Last Year: 0    Homeless in the Last Year: No  Utilities: Not At Risk (05/26/2023)   AHC Utilities    Threatened with loss of utilities: No  Health Literacy: Adequate Health Literacy (05/26/2023)   B1300 Health Literacy    Frequency of need for help with medical instructions: Never   Review of Systems  Constitutional:  Negative for chills, fatigue and fever.  HENT:  Negative for congestion, ear pain and sore throat.   Respiratory:  Negative for cough and shortness of breath.   Cardiovascular:  Negative for chest pain and palpitations.  Gastrointestinal:  Negative for abdominal pain, constipation, diarrhea, nausea and vomiting.  Genitourinary:  Negative for difficulty urinating and dysuria.  Musculoskeletal:  Negative for arthralgias, back pain and myalgias.  Skin:  Negative for rash.  Neurological:  Negative for dizziness and headaches.  Psychiatric/Behavioral:  Negative for dysphoric mood.      Objective:  There were no vitals taken for this visit.     08/22/2023    3:26 PM 06/23/2023    9:24 AM 06/23/2023    9:14 AM  BP/Weight  Systolic BP 139 117 107  Diastolic BP 77 80 83    Physical Exam Vitals reviewed.  Constitutional:      Appearance: Normal appearance.  HENT:     Right Ear: Tympanic membrane normal.     Left Ear: Tympanic membrane normal.     Nose: Nose normal.     Mouth/Throat:     Pharynx: No oropharyngeal exudate or posterior oropharyngeal erythema.  Eyes:     Conjunctiva/sclera: Conjunctivae normal.  Neck:     Vascular: No carotid bruit.  Cardiovascular:     Rate and Rhythm: Normal rate and regular rhythm.     Heart sounds: Normal heart sounds.  Pulmonary:     Effort: Pulmonary effort is normal.     Breath sounds: Normal breath sounds.  Abdominal:      General: Bowel sounds are normal.     Palpations: Abdomen is soft.     Tenderness: There is no abdominal tenderness.  Skin:    Findings: No lesion or rash.  Neurological:     Mental Status: He is alert and oriented to person, place, and time.  Psychiatric:        Mood and Affect: Mood normal.        Behavior: Behavior normal.     Lab Results  Component Value Date   WBC 6.9 05/26/2023   HGB 13.5 05/26/2023   HCT 39.8 05/26/2023   PLT 241 05/26/2023   GLUCOSE 97 06/18/2023   CHOL 187 05/26/2023   TRIG 84 05/26/2023   HDL 80 05/26/2023   LDLCALC 92 05/26/2023   ALT 46 (H) 06/18/2023   AST 32 06/18/2023   NA 137 06/18/2023   K 5.2 06/18/2023   CL 98 06/18/2023   CREATININE 1.14 06/18/2023   BUN 11 06/18/2023   CO2 24 06/18/2023   TSH 1.390 05/26/2023   HGBA1C 5.7 (H) 05/26/2023      Assessment & Plan:   Assessment & Plan Primary hypertension Primary hypertension Hypertension managed with amlodipine 5mg , benazepril  40mg , carvedilol  6.25, and Inspra 25mg . Cardiologist oversees medication. - Continue current antihypertensive regimen as managed by cardiologist. Orders:   CBC with Differential   Comprehensive metabolic panel with GFR   T4, free   TSH  Prediabetes Prediabetes Monitored with A1c testing. - Ordered A1c test to monitor blood glucose levels. Orders:   Hemoglobin A1c  Mixed hyperlipidemia Mixed hyperlipidemia - Ordered cholesterol test to monitor lipid levels. Orders:  CBC with Differential   Comprehensive metabolic panel with GFR   Lipid Panel  Erectile dysfunction due to diseases classified elsewhere Controlled Denies any new or worsening symptoms Continue taking Cialis  5mg  as prescribed Orders:   PSA   tadalafil  (CIALIS ) 5 MG tablet; Take 1 tablet (5 mg total) by mouth daily.  Routine medical exam General Health Maintenance Routine health maintenance includes blood work and PSA screening. - Ordered blood work including blood count,  kidney and liver function tests, A1c, cholesterol, and thyroid function tests. - Ordered PSA test for prostate screening. - Refilled all medications for one year supply. Orders:   rosuvastatin  (CRESTOR ) 40 MG tablet; Take 1 tablet (40 mg total) by mouth daily.   allopurinol  (ZYLOPRIM ) 100 MG tablet; Take 1 tablet (100 mg total) by mouth daily.     There is no height or weight on file to calculate BMI.   These are the goals we discussed:  Goals   None      This is a list of the screening recommended for you and due dates:  Health Maintenance  Topic Date Due   Hepatitis B Vaccine (1 of 3 - 19+ 3-dose series) Never done   Pneumococcal Vaccine for age over 31 (1 of 1 - PCV) Never done   Flu Shot  01/07/2024   COVID-19 Vaccine (4 - 2025-26 season) 02/07/2024   Colon Cancer Screening  06/22/2028   DTaP/Tdap/Td vaccine (3 - Td or Tdap) 07/15/2031   Zoster (Shingles) Vaccine  Completed   HPV Vaccine  Aged Out   Meningitis B Vaccine  Aged Out   Hepatitis C Screening  Discontinued   HIV Screening  Discontinued     No orders of the defined types were placed in this encounter.   Follow-up: No follow-ups on file.  An After Visit Summary was printed and given to the patient.  Nola Angles, [place] [facility] [phone]  "

## 2024-06-21 ENCOUNTER — Ambulatory Visit (INDEPENDENT_AMBULATORY_CARE_PROVIDER_SITE_OTHER): Payer: BC Managed Care – PPO | Admitting: Physician Assistant

## 2024-06-21 ENCOUNTER — Encounter: Payer: Self-pay | Admitting: Physician Assistant

## 2024-06-21 ENCOUNTER — Encounter: Payer: BC Managed Care – PPO | Admitting: Family Medicine

## 2024-06-21 VITALS — BP 138/60 | HR 72 | Temp 97.3°F | Ht 70.0 in | Wt 178.5 lb

## 2024-06-21 DIAGNOSIS — N521 Erectile dysfunction due to diseases classified elsewhere: Secondary | ICD-10-CM | POA: Diagnosis not present

## 2024-06-21 DIAGNOSIS — Z0000 Encounter for general adult medical examination without abnormal findings: Secondary | ICD-10-CM

## 2024-06-21 DIAGNOSIS — R7303 Prediabetes: Secondary | ICD-10-CM

## 2024-06-21 DIAGNOSIS — I10 Essential (primary) hypertension: Secondary | ICD-10-CM | POA: Diagnosis not present

## 2024-06-21 DIAGNOSIS — E782 Mixed hyperlipidemia: Secondary | ICD-10-CM | POA: Diagnosis not present

## 2024-06-21 MED ORDER — ROSUVASTATIN CALCIUM 40 MG PO TABS: 40.0000 mg | ORAL_TABLET | Freq: Every day | ORAL | 3 refills | Status: AC

## 2024-06-21 MED ORDER — ALLOPURINOL 100 MG PO TABS: 100.0000 mg | ORAL_TABLET | Freq: Every day | ORAL | 3 refills | Status: AC

## 2024-06-21 MED ORDER — TADALAFIL 5 MG PO TABS: 5.0000 mg | ORAL_TABLET | Freq: Every day | ORAL | 3 refills | Status: AC

## 2024-06-21 NOTE — Assessment & Plan Note (Signed)
 Mixed hyperlipidemia - Ordered cholesterol test to monitor lipid levels. Orders:   CBC with Differential   Comprehensive metabolic panel with GFR   Lipid Panel

## 2024-06-21 NOTE — Assessment & Plan Note (Signed)
 Controlled Denies any new or worsening symptoms Continue taking Cialis  5mg  as prescribed Orders:   PSA   tadalafil  (CIALIS ) 5 MG tablet; Take 1 tablet (5 mg total) by mouth daily.

## 2024-06-21 NOTE — Assessment & Plan Note (Signed)
 General Health Maintenance Routine health maintenance includes blood work and PSA screening. - Ordered blood work including blood count, kidney and liver function tests, A1c, cholesterol, and thyroid function tests. - Ordered PSA test for prostate screening. - Refilled all medications for one year supply. Orders:   rosuvastatin  (CRESTOR ) 40 MG tablet; Take 1 tablet (40 mg total) by mouth daily.   allopurinol  (ZYLOPRIM ) 100 MG tablet; Take 1 tablet (100 mg total) by mouth daily.

## 2024-06-21 NOTE — Assessment & Plan Note (Signed)
 Primary hypertension Hypertension managed with amlodipine 5mg , benazepril  40mg , carvedilol  6.25, and Inspra 25mg . Cardiologist oversees medication. - Continue current antihypertensive regimen as managed by cardiologist. Orders:   CBC with Differential   Comprehensive metabolic panel with GFR   T4, free   TSH

## 2024-06-21 NOTE — Assessment & Plan Note (Signed)
 Prediabetes Monitored with A1c testing. - Ordered A1c test to monitor blood glucose levels. Orders:   Hemoglobin A1c

## 2024-06-22 LAB — CBC WITH DIFFERENTIAL/PLATELET
Basophils Absolute: 0 x10E3/uL (ref 0.0–0.2)
Basos: 0 %
EOS (ABSOLUTE): 0.1 x10E3/uL (ref 0.0–0.4)
Eos: 1 %
Hematocrit: 39.5 % (ref 37.5–51.0)
Hemoglobin: 12.6 g/dL — ABNORMAL LOW (ref 13.0–17.7)
Immature Grans (Abs): 0 x10E3/uL (ref 0.0–0.1)
Immature Granulocytes: 0 %
Lymphocytes Absolute: 2.4 x10E3/uL (ref 0.7–3.1)
Lymphs: 23 %
MCH: 29.8 pg (ref 26.6–33.0)
MCHC: 31.9 g/dL (ref 31.5–35.7)
MCV: 93 fL (ref 79–97)
Monocytes Absolute: 0.7 x10E3/uL (ref 0.1–0.9)
Monocytes: 7 %
Neutrophils Absolute: 7 x10E3/uL (ref 1.4–7.0)
Neutrophils: 69 %
Platelets: 254 x10E3/uL (ref 150–450)
RBC: 4.23 x10E6/uL (ref 4.14–5.80)
RDW: 13.1 % (ref 11.6–15.4)
WBC: 10.2 x10E3/uL (ref 3.4–10.8)

## 2024-06-22 LAB — LIPID PANEL
Chol/HDL Ratio: 2.5 ratio (ref 0.0–5.0)
Cholesterol, Total: 169 mg/dL (ref 100–199)
HDL: 68 mg/dL
LDL Chol Calc (NIH): 83 mg/dL (ref 0–99)
Triglycerides: 98 mg/dL (ref 0–149)
VLDL Cholesterol Cal: 18 mg/dL (ref 5–40)

## 2024-06-22 LAB — COMPREHENSIVE METABOLIC PANEL WITH GFR
ALT: 27 IU/L (ref 0–44)
AST: 27 IU/L (ref 0–40)
Albumin: 4.5 g/dL (ref 3.8–4.9)
Alkaline Phosphatase: 45 IU/L — ABNORMAL LOW (ref 47–123)
BUN/Creatinine Ratio: 13 (ref 9–20)
BUN: 16 mg/dL (ref 6–24)
Bilirubin Total: 0.4 mg/dL (ref 0.0–1.2)
CO2: 24 mmol/L (ref 20–29)
Calcium: 9.7 mg/dL (ref 8.7–10.2)
Chloride: 99 mmol/L (ref 96–106)
Creatinine, Ser: 1.26 mg/dL (ref 0.76–1.27)
Globulin, Total: 2.1 g/dL (ref 1.5–4.5)
Glucose: 96 mg/dL (ref 70–99)
Potassium: 5.1 mmol/L (ref 3.5–5.2)
Sodium: 138 mmol/L (ref 134–144)
Total Protein: 6.6 g/dL (ref 6.0–8.5)
eGFR: 67 mL/min/1.73

## 2024-06-22 LAB — TSH: TSH: 1.48 u[IU]/mL (ref 0.450–4.500)

## 2024-06-22 LAB — HEMOGLOBIN A1C
Est. average glucose Bld gHb Est-mCnc: 114 mg/dL
Hgb A1c MFr Bld: 5.6 % (ref 4.8–5.6)

## 2024-06-22 LAB — PSA: Prostate Specific Ag, Serum: 1.6 ng/mL (ref 0.0–4.0)

## 2024-06-22 LAB — T4, FREE: Free T4: 1.12 ng/dL (ref 0.82–1.77)

## 2024-06-23 ENCOUNTER — Other Ambulatory Visit: Payer: Self-pay | Admitting: Family Medicine

## 2024-06-23 ENCOUNTER — Ambulatory Visit: Payer: Self-pay | Admitting: Physician Assistant

## 2024-06-23 DIAGNOSIS — Z0000 Encounter for general adult medical examination without abnormal findings: Secondary | ICD-10-CM

## 2025-06-27 ENCOUNTER — Encounter: Admitting: Family Medicine
